# Patient Record
Sex: Male | Born: 1960
Health system: Southern US, Community
[De-identification: ages and names within clinical notes are randomized; demographics above are authoritative.]

## PROBLEM LIST (undated history)

## (undated) DIAGNOSIS — M5412 Radiculopathy, cervical region: Secondary | ICD-10-CM

## (undated) DIAGNOSIS — I10 Essential (primary) hypertension: Secondary | ICD-10-CM

---

## 2002-11-03 ENCOUNTER — Emergency Department (HOSPITAL_COMMUNITY): Admission: EM | Admit: 2002-11-03 | Discharge: 2002-11-03 | Payer: Self-pay | Admitting: Emergency Medicine

## 2007-12-09 ENCOUNTER — Emergency Department (HOSPITAL_COMMUNITY): Admission: EM | Admit: 2007-12-09 | Discharge: 2007-12-09 | Payer: Self-pay | Admitting: Family Medicine

## 2009-01-21 ENCOUNTER — Encounter: Admission: RE | Admit: 2009-01-21 | Discharge: 2009-01-21 | Payer: Self-pay | Admitting: Infectious Diseases

## 2011-12-18 ENCOUNTER — Encounter (HOSPITAL_COMMUNITY): Payer: Self-pay | Admitting: Emergency Medicine

## 2011-12-18 DIAGNOSIS — R51 Headache: Secondary | ICD-10-CM | POA: Insufficient documentation

## 2011-12-18 DIAGNOSIS — I10 Essential (primary) hypertension: Secondary | ICD-10-CM | POA: Insufficient documentation

## 2011-12-18 NOTE — ED Notes (Signed)
Reports headaches since Sunday night- reports feeling lethargic- vomited yesterday; blurred vision; pt face symmetrical, grips equal, speech clear, no drift

## 2011-12-19 ENCOUNTER — Encounter (HOSPITAL_COMMUNITY): Payer: Self-pay | Admitting: Radiology

## 2011-12-19 ENCOUNTER — Emergency Department (HOSPITAL_COMMUNITY): Payer: PRIVATE HEALTH INSURANCE

## 2011-12-19 ENCOUNTER — Emergency Department (HOSPITAL_COMMUNITY)
Admission: EM | Admit: 2011-12-19 | Discharge: 2011-12-19 | Disposition: A | Discharge status: Home / Self Care | Payer: PRIVATE HEALTH INSURANCE | Attending: Emergency Medicine | Admitting: Emergency Medicine

## 2011-12-19 DIAGNOSIS — R51 Headache: Secondary | ICD-10-CM

## 2011-12-19 DIAGNOSIS — I10 Essential (primary) hypertension: Secondary | ICD-10-CM

## 2011-12-19 LAB — CBC WITH DIFFERENTIAL/PLATELET
Basophils Absolute: 0 10*3/uL (ref 0.0–0.1)
Eosinophils Absolute: 0.1 10*3/uL (ref 0.0–0.7)
Lymphocytes Relative: 39 % (ref 12–46)
Lymphs Abs: 2.1 10*3/uL (ref 0.7–4.0)
Neutrophils Relative %: 51 % (ref 43–77)
Platelets: 217 10*3/uL (ref 150–400)
RBC: 4.04 MIL/uL — ABNORMAL LOW (ref 4.22–5.81)
WBC: 5.3 10*3/uL (ref 4.0–10.5)

## 2011-12-19 LAB — COMPREHENSIVE METABOLIC PANEL
ALT: 13 U/L (ref 0–53)
AST: 15 U/L (ref 0–37)
Alkaline Phosphatase: 78 U/L (ref 39–117)
CO2: 25 mEq/L (ref 19–32)
GFR calc Af Amer: >90 mL/min (ref >90)
Glucose, Bld: 112 mg/dL — ABNORMAL HIGH (ref 70–99)
Potassium: 3.4 mEq/L — ABNORMAL LOW (ref 3.5–5.1)
Sodium: 142 mEq/L (ref 135–145)
Total Protein: 7.2 g/dL (ref 6.0–8.3)

## 2011-12-19 MED ORDER — SODIUM CHLORIDE 0.9 % IV SOLN: INTRAVENOUS | Status: DC

## 2011-12-19 MED ORDER — SODIUM CHLORIDE 0.9 % IV BOLUS (SEPSIS)
250.0000 mL | Freq: Once | INTRAVENOUS | Status: AC
  Administered 2011-12-19: 250 mL via INTRAVENOUS

## 2011-12-19 MED ORDER — ONDANSETRON HCL 4 MG/2ML IJ SOLN
INTRAMUSCULAR | Status: AC
  Filled 2011-12-19: qty 2

## 2011-12-19 MED ORDER — HYDROCODONE-ACETAMINOPHEN 5-325 MG PO TABS: 1.0000 | ORAL_TABLET | Freq: Four times a day (QID) | ORAL | Status: AC | PRN

## 2011-12-19 MED ORDER — HYDROMORPHONE HCL PF 1 MG/ML IJ SOLN
INTRAMUSCULAR | Status: AC
  Filled 2011-12-19: qty 1

## 2011-12-19 MED ORDER — ONDANSETRON HCL 4 MG/2ML IJ SOLN
4.0000 mg | Freq: Once | INTRAMUSCULAR | Status: AC
  Administered 2011-12-19: 4 mg via INTRAVENOUS

## 2011-12-19 MED ORDER — ONDANSETRON HCL 4 MG/2ML IJ SOLN: 4.0000 mg | Freq: Once | INTRAMUSCULAR | Status: DC

## 2011-12-19 MED ORDER — HYDROMORPHONE HCL PF 1 MG/ML IJ SOLN: 1.0000 mg | Freq: Once | INTRAMUSCULAR | Status: DC

## 2011-12-19 MED ORDER — HYDROMORPHONE HCL PF 1 MG/ML IJ SOLN
1.0000 mg | Freq: Once | INTRAMUSCULAR | Status: AC
  Administered 2011-12-19: 1 mg via INTRAVENOUS

## 2011-12-19 NOTE — ED Provider Notes (Signed)
History     CSN: [id number]  Arrival date & time 12/18/11  2335   First MD Initiated Contact with Patient 12/19/11 0025      Chief Complaint  Patient presents with  . Headache    (Consider location/radiation/quality/duration/timing/severity/associated sxs/prior treatment) Patient is a 51 y.o. male presenting with headaches. The history is provided by the patient.  Headache  The current episode started 2 days ago. The problem occurs constantly. The headache is associated with nothing. The pain is located in the frontal region. The quality of the pain is described as sharp. The pain is at a severity of 10/10. The pain is moderate. The pain does not radiate. Associated symptoms include nausea and vomiting. Pertinent negatives include no fever, no chest pressure, no near-syncope, no palpitations, no syncope and no shortness of breath.   Patient with a history of bad headaches in the past but none lately. His headache associated with some nausea some blurred vision and rare vomiting.    History reviewed. No pertinent past medical history.  History reviewed. No pertinent past surgical history.  History reviewed. No pertinent family history.  History  Substance Use Topics  . Smoking status: Never Smoker   . Smokeless tobacco: Not on file  . Alcohol Use: No      Review of Systems  Constitutional: Negative for fever.  HENT: Negative for congestion, neck pain and neck stiffness.   Eyes: Positive for visual disturbance.  Respiratory: Negative for shortness of breath.   Cardiovascular: Negative for chest pain, palpitations, syncope and near-syncope.  Gastrointestinal: Positive for nausea and vomiting. Negative for abdominal pain.  Genitourinary: Negative for dysuria.  Musculoskeletal: Negative for back pain.  Skin: Negative for rash.  Neurological: Positive for headaches. Negative for weakness and numbness.  Hematological: Does not bruise/bleed easily.    Allergies  Review of  patient's allergies indicates no known allergies.  Home Medications   Current Outpatient Rx  Name Route Sig Dispense Refill  . HYDROCODONE-ACETAMINOPHEN 5-325 MG PO TABS Oral Take 1-2 tablets by mouth every 6 (six) hours as needed for pain. 10 tablet 0  . NAPROXEN SODIUM 220 MG PO TABS Oral Take 220 mg by mouth once. For pain      BP 168/108  Pulse 70  Temp 98.2 F (36.8 C) (Oral)  Resp 20  SpO2 100%  Physical Exam  Nursing note and vitals reviewed. Constitutional: He is oriented to person, place, and time. He appears well-developed and well-nourished. No distress.  HENT:  Head: Normocephalic and atraumatic.  Mouth/Throat: Oropharynx is clear and moist.  Eyes: Conjunctivae and EOM are normal. Pupils are equal, round, and reactive to light.  Neck: Normal range of motion. Neck supple.  Cardiovascular: Normal rate, regular rhythm and normal heart sounds.   No murmur heard. Pulmonary/Chest: Effort normal and breath sounds normal.  Abdominal: Soft. Bowel sounds are normal. There is no tenderness.  Musculoskeletal: Normal range of motion.  Neurological: He is alert and oriented to person, place, and time. No cranial nerve deficit. He exhibits normal muscle tone. Coordination normal.  Skin: No rash noted.    ED Course  Procedures (including critical care time)  Labs Reviewed  CBC WITH DIFFERENTIAL - Abnormal; Notable for the following:    RBC 4.04 (*)     Hemoglobin 12.1 (*)     HCT 35.2 (*)     All other components within normal limits  COMPREHENSIVE METABOLIC PANEL - Abnormal; Notable for the following:    Potassium 3.4 (*)  Glucose, Bld 112 (*)     Total Bilirubin 0.2 (*)     All other components within normal limits   Dg Chest 2 View  12/19/2011  *RADIOLOGY REPORT*  Clinical Data: Headache  CHEST - 2 VIEW  Comparison: 01/21/2009  Findings: Lung volumes are low with crowding of the bronchovascular markings.  Heart size and upper limits of normal.  No pleural effusion.   No acute osseous abnormality.  IMPRESSION: Low volumes with crowding of the bronchovascular markings but no focal acute opacity allowing for this.  If symptoms persist, consider re-imaging at full inspiration to help differentiate from possible underlying viral / atypical etiologies.  Original Report Authenticated By: Harrel Lemon, M.D.   Ct Head Wo Contrast  12/19/2011  *RADIOLOGY REPORT*  Clinical Data: Headache  CT HEAD WITHOUT CONTRAST  Technique:  Contiguous axial images were obtained from the base of the skull through the vertex without contrast.  Comparison: None.  Findings: No acute hemorrhage, acute infarction, or mass lesion is identified.  No midline shift.  No ventriculomegaly.  No skull fracture.  Orbits and paranasal sinuses are intact.  IMPRESSION: No acute intracranial finding.  Original Report Authenticated By: Harrel Lemon, M.D.     1. Headache   2. Hypertension       MDM    Patient with a frontal headache for 2 days associated with some mild slight blurred vision some nausea and rare vomiting. Patient used to have a history of severe headaches but hasn't had any lately. Past medical history is negative. Lites workup was negative head CT was normal labs without any sniffing abnormalities patient's headache improved significantly with pain medicine.  Patient is nontoxic no acute distress. Not concerned about subarachnoid hemorrhage bleed.  Discharge home with resource guide for followup.       Shelda Jakes, MD 12/19/11 [phone]

## 2011-12-19 NOTE — ED Notes (Signed)
Pt. Returned from xray 

## 2011-12-19 NOTE — ED Notes (Signed)
Pt. To x-ray .

## 2012-08-25 ENCOUNTER — Encounter (HOSPITAL_COMMUNITY): Payer: Self-pay | Admitting: Emergency Medicine

## 2012-08-25 ENCOUNTER — Emergency Department (HOSPITAL_COMMUNITY)
Admission: EM | Admit: 2012-08-25 | Discharge: 2012-08-26 | Disposition: A | Discharge status: Home / Self Care | Payer: BC Managed Care – PPO | Attending: Emergency Medicine | Admitting: Emergency Medicine

## 2012-08-25 DIAGNOSIS — IMO0001 Reserved for inherently not codable concepts without codable children: Secondary | ICD-10-CM | POA: Insufficient documentation

## 2012-08-25 DIAGNOSIS — M5412 Radiculopathy, cervical region: Secondary | ICD-10-CM

## 2012-08-25 NOTE — ED Notes (Signed)
PT. REPORTS MUSCLE PAIN AT RIGHT DELTOID RADIATING TO RIGHT SHOULDER/RIGHT NECK , STATES PAIN DUE TO WRITING A LOT / USING COMPUTER MOUSE . PAIN WORSE WITH MOVEMENT/PALPATION .

## 2012-08-26 MED ORDER — OXYCODONE-ACETAMINOPHEN 5-325 MG PO TABS: 2.0000 | ORAL_TABLET | ORAL | Status: DC | PRN

## 2012-08-26 MED ORDER — PREDNISONE 20 MG PO TABS: ORAL_TABLET | ORAL | Status: DC

## 2012-08-26 NOTE — ED Notes (Signed)
The patient is AOx4 and comfortable with his discharge instructions. 

## 2012-08-26 NOTE — ED Provider Notes (Signed)
History     CSN: [id number]  Arrival date & time 08/25/12  2223   First MD Initiated Contact with Patient 08/25/12 2358      Chief Complaint  Patient presents with  . Arm Pain    (Consider location/radiation/quality/duration/timing/severity/associated sxs/prior treatment) HPI This 52 year old male has a couple weeks of a constant nonstop burning severe right-sided neck pain radiating down towards his right shoulder and upper arm towards the elbow worse with certain position changes and better when he tries to sleep at night with his arm as above and behind his head, he does not have any chest pain cough shortness of breath exertional pain abdominal pain trauma or weakness or numbness to his right arm and no change in bowel or bladder function and no pain in his left arm or his legs. He has had like this off-and-on for weeks that time over the years but never saw a doctor for it before. There is no treatment prior to arrival. History reviewed. No pertinent past medical history.  History reviewed. No pertinent past surgical history.  No family history on file.  History  Substance Use Topics  . Smoking status: Never Smoker   . Smokeless tobacco: Not on file  . Alcohol Use: No      Review of Systems 10 Systems reviewed and are negative for acute change except as noted in the HPI. Allergies  Review of patient's allergies indicates no known allergies.  Home Medications   Current Outpatient Rx  Name  Route  Sig  Dispense  Refill  . oxyCODONE-acetaminophen (PERCOCET) 5-325 MG per tablet   Oral   Take 2 tablets by mouth every 4 (four) hours as needed for pain.   20 tablet   0   . predniSONE (DELTASONE) 20 MG tablet      3 tabs po day one, then 2 po daily x 4 days   11 tablet   0     BP 127/84  Pulse 74  Temp(Src) 97.1 F (36.2 C) (Oral)  Resp 18  Ht 5\' 8"  (1.727 m)  Wt 175 lb (79.379 kg)  BMI 26.61 kg/m2  SpO2 98%  Physical Exam  Nursing note and vitals  reviewed. Constitutional:  Awake, alert, nontoxic appearance.  HENT:  Head: Atraumatic.  Eyes: Right eye exhibits no discharge. Left eye exhibits no discharge.  Neck: Neck supple.  Cervical spine nontender, right paracervical mild tenderness soft tissue without erythema induration fluctuance or increased warmth, right trapezius area tender as well  Cardiovascular: Normal rate and regular rhythm.   No murmur heard. Pulmonary/Chest: Effort normal and breath sounds normal. No respiratory distress. He has no wheezes. He has no rales. He exhibits no tenderness.  Abdominal: Soft. Bowel sounds are normal. There is no tenderness. There is no rebound.  Musculoskeletal: He exhibits no edema and no tenderness.  Baseline ROM, no obvious new focal weakness. Left arm and both legs nontender. Right arm is nontender at the shoulder elbow forearm wrist and hand. Right hand has capillary refill less than 2 seconds. Right arm has normal light touch and 5 out of 5 strength in the distributions of the median radial ulnar and axillary nerve function. Right shoulder has negative drop test and no worsening pain with internal or external rotation.  Neurological: He is alert.  Mental status and motor strength appears baseline for patient and situation.  Skin: No rash noted.  Psychiatric: He has a normal mood and affect.    ED Course  Procedures (including  critical care time)  Labs Reviewed - No data to display No results found.   1. Cervical radiculopathy       MDM  Patient / Family / Caregiver informed of clinical course, understand medical decision-making process, and agree with plan.  I doubt any other EMC precluding discharge at this time including, but not necessarily limited to the following:CVA, ACS.        Hurman Horn, MD 08/29/12 2055

## 2012-09-01 ENCOUNTER — Emergency Department (HOSPITAL_COMMUNITY)
Admission: EM | Admit: 2012-09-01 | Discharge: 2012-09-01 | Disposition: A | Discharge status: Home / Self Care | Payer: BC Managed Care – PPO | Attending: Emergency Medicine | Admitting: Emergency Medicine

## 2012-09-01 ENCOUNTER — Encounter (HOSPITAL_COMMUNITY): Payer: Self-pay | Admitting: Emergency Medicine

## 2012-09-01 DIAGNOSIS — M5412 Radiculopathy, cervical region: Secondary | ICD-10-CM

## 2012-09-01 DIAGNOSIS — M25519 Pain in unspecified shoulder: Secondary | ICD-10-CM | POA: Insufficient documentation

## 2012-09-01 MED ORDER — OXYCODONE-ACETAMINOPHEN 5-325 MG PO TABS: 1.0000 | ORAL_TABLET | ORAL | Status: DC | PRN

## 2012-09-01 NOTE — ED Notes (Signed)
Pt c/o neck and right shoulder pain x several weeks; pt seen here for same

## 2012-09-01 NOTE — ED Provider Notes (Signed)
History  This chart was scribed for John Booze, MD by Shari Heritage, ED Scribe. The patient was seen in room TR05C/TR05C. Patient's care was started at 1630.   CSN: [id number]  Arrival date & time 09/01/12  1559   First MD Initiated Contact with Patient 09/01/12 1630      Chief Complaint  Patient presents with  . Neck Pain  . Shoulder Pain     The history is provided by the patient. No language interpreter was used.    HPI Comments: John Collins is a 52 y.o. male who presents to the Emergency Department complaining of moderate to severe, right-sided neck pain that radiates down his right shoulder and right upper arm for the past several weeks. Patient describes pain as "spasm."  Pain is relieved with rest of his right arm. He says that pain is aggravating by use including writing and computer work. Patient reports that when pain first began several weeks ago, it was intermittent, but it is recurring more frequently now. Patient says the medicines given last week during his last visit for the same problem have relieved pain, but he has run out. He states that he was unable to set up an appointment for neurology and is having persistent pain. There is no fever, nausea, vomiting, chills, numbness or weakness. He reports no other pertinent past medical history. Patient does not smoke.   Previous Chart Review: Per medical records, patient was seen here on 08/25/2012 for right-sided neck pain radiating down his right shoulder and upper arm. Patient was diagnosed with cervical radiculopathy and given 11 tablets of prednisone 20 mg and 20 tablets of Percocet 5-325 mg. Patient also given referral to neurology.  History reviewed. No pertinent past medical history.  History reviewed. No pertinent past surgical history.  History reviewed. No pertinent family history.  History  Substance Use Topics  . Smoking status: Never Smoker   . Smokeless tobacco: Not on file  . Alcohol Use: No       Review of Systems  Constitutional: Negative for fever and chills.  HENT: Positive for neck pain.   Gastrointestinal: Negative for nausea and vomiting.  Neurological: Negative for weakness and numbness.    Allergies  Review of patient's allergies indicates no known allergies.  Home Medications   Current Outpatient Rx  Name  Route  Sig  Dispense  Refill  . oxyCODONE-acetaminophen (PERCOCET) 5-325 MG per tablet   Oral   Take 2 tablets by mouth every 4 (four) hours as needed for pain.   20 tablet   0   . predniSONE (DELTASONE) 20 MG tablet      3 tabs po day one, then 2 po daily x 4 days   11 tablet   0     Triage Vitals: BP 107/89  Pulse 94  Temp(Src) 98.3 F (36.8 C) (Oral)  Resp 18  SpO2 97%  Physical Exam  Constitutional: He is oriented to person, place, and time. He appears well-developed and well-nourished.  HENT:  Head: Normocephalic and atraumatic.  Eyes: Conjunctivae and EOM are normal. Pupils are equal, round, and reactive to light.  Neck: Normal range of motion. Neck supple.  Cardiovascular: Normal rate, regular rhythm and normal heart sounds.   Pulmonary/Chest: Effort normal and breath sounds normal.  Musculoskeletal: Normal range of motion.  Neurological: He is alert and oriented to person, place, and time.  Motor and sensory exam of the right arm is completely normal. Normal strength of all muscles tested. No sensory  loss.   Skin: Skin is warm and dry.    ED Course  Procedures (including critical care time) DIAGNOSTIC STUDIES: Oxygen Saturation is 97% on room air, adequate by my interpretation.    COORDINATION OF CARE: 4:38 PM- Patient informed of current plan for treatment and evaluation and agrees with plan at this time.     1. Cervical radiculopathy       MDM  . Old records are reviewed and he had been seen one week ago with the same complaint and given a course of prednisone and Percocet. There is no indication for continuing  steroids and I do not see any indication for imaging at this time. He had been referred to [facility]. I'm also giving him a referral to Dr. Arbutus Leas of [facility] if he might be able to get in sooner. He is advised to take over-the-counter naproxen and a prescription is given for more Percocet.     I personally performed the services described in this documentation, which was scribed in my presence. The recorded information has been reviewed and is accurate.     John Booze, MD 09/01/12 [phone]

## 2012-09-01 NOTE — ED Notes (Signed)
Pt states he has been unable to get an appointment with referral MD as of yet. Needs more pain meds until then.

## 2012-09-05 ENCOUNTER — Encounter (HOSPITAL_COMMUNITY): Payer: Self-pay | Admitting: *Deleted

## 2012-09-05 ENCOUNTER — Emergency Department (HOSPITAL_COMMUNITY)
Admission: EM | Admit: 2012-09-05 | Discharge: 2012-09-05 | Disposition: A | Discharge status: Home / Self Care | Payer: Self-pay | Attending: Emergency Medicine | Admitting: Emergency Medicine

## 2012-09-05 DIAGNOSIS — Z87828 Personal history of other (healed) physical injury and trauma: Secondary | ICD-10-CM | POA: Insufficient documentation

## 2012-09-05 DIAGNOSIS — M5412 Radiculopathy, cervical region: Secondary | ICD-10-CM | POA: Insufficient documentation

## 2012-09-05 DIAGNOSIS — M79609 Pain in unspecified limb: Secondary | ICD-10-CM | POA: Insufficient documentation

## 2012-09-05 HISTORY — DX: Radiculopathy, cervical region: M54.12

## 2012-09-05 MED ORDER — IBUPROFEN 800 MG PO TABS: 800.0000 mg | ORAL_TABLET | Freq: Three times a day (TID) | ORAL | Status: DC

## 2012-09-05 MED ORDER — HYDROCODONE-ACETAMINOPHEN 5-325 MG PO TABS: 1.0000 | ORAL_TABLET | Freq: Four times a day (QID) | ORAL | Status: DC | PRN

## 2012-09-05 MED ORDER — CYCLOBENZAPRINE HCL 10 MG PO TABS: 10.0000 mg | ORAL_TABLET | Freq: Two times a day (BID) | ORAL | Status: DC | PRN

## 2012-09-05 NOTE — ED Notes (Signed)
Ortho tech at bedside 

## 2012-09-05 NOTE — ED Provider Notes (Signed)
  Medical screening examination/treatment/procedure(s) were performed by non-physician practitioner and as supervising physician I was immediately available for consultation/collaboration.    Gerhard Munch, MD 09/05/12 [phone]

## 2012-09-05 NOTE — ED Notes (Signed)
Pt seen here twice 3/24 and 3/31 and tx for cervical radiculopathy.  States his neuro appointment isn't until 4/9 and his pain is unbearable. His still has medications, but they are not controlling the pain.

## 2012-09-05 NOTE — Progress Notes (Signed)
Orthopedic Tech Progress Note Patient Details:  John Collins Dec 01, 1960 [id number]  Ortho Devices Type of Ortho Device: Soft collar Ortho Device/Splint Location: neck Ortho Device/Splint Interventions: Application   [name] 09/05/2012, 3:46 PM

## 2012-09-05 NOTE — ED Notes (Signed)
Pt reports 10/10 constant neck and upper right arm pain.  Pt states he cannot get into any comfortable position and cannot rest.  Pt has been seen 3/24 and 3/31 for same pain but states the pain medication isnt helping him.  Pt has neuro appt 4/9 but cannot tolerate pain in the mean time.  Pt denies injury.  Pt alert oriented X4

## 2012-09-05 NOTE — ED Provider Notes (Signed)
History    This chart was scribed for non-physician practitioner working with Gerhard Munch, MD by Frederik Pear, ED Scribe. This patient was seen in room TR06C/TR06C and the patient's care was started at 1518.   CSN: [id number]  Arrival date & time 09/05/12  1423   None     Chief Complaint  Patient presents with  . Neck Pain  . Arm Pain    (Consider location/radiation/quality/duration/timing/severity/associated sxs/prior treatment) Patient is a 52 y.o. male presenting with general illness. The history is provided by the patient and medical records. No language interpreter was used.  Illness  The current episode started more than 2 weeks ago. The onset was gradual. The problem occurs continuously. The problem has been gradually worsening. The problem is severe. Associated symptoms include neck pain.    John Collins is a 52 y.o. male with no h/o of prior neck injuries who presents to the Emergency Department complaining of gradual onset, gradually worsening, constant, severe right arm pain that began several weeks ago that has since radiated to the right side of his neck and his right shoulder. He denies any known injury, but reports that the pain is aggravated by spending more time typing on the computer.  He was seen here for the same on 3/24 and 03/31 and was prescribed a course of prednisone and Percocet for cervical radiculopathy, which he reports is providing no relief for the pain. He states that he has an appointment with a neurologist on 04/09, but is unable to withstand the current pain.  Past Medical History  Diagnosis Date  . Cervical radiculopathy     No past surgical history on file.  No family history on file.  History  Substance Use Topics  . Smoking status: Never Smoker   . Smokeless tobacco: Not on file  . Alcohol Use: No      Review of Systems  HENT: Positive for neck pain.   Musculoskeletal:       Right arm and shoulder pain  All other systems  reviewed and are negative.    Allergies  Review of patient's allergies indicates no known allergies.  Home Medications   Current Outpatient Rx  Name  Route  Sig  Dispense  Refill  . oxyCODONE-acetaminophen (PERCOCET/ROXICET) 5-325 MG per tablet   Oral   Take 1 tablet by mouth every 4 (four) hours as needed for pain.   20 tablet   0     BP 160/98  Pulse 82  Temp(Src) 98.5 F (36.9 C) (Oral)  Resp 18  SpO2 98%  Physical Exam  Nursing note and vitals reviewed. Constitutional: He is oriented to person, place, and time. He appears well-developed and well-nourished. No distress.  HENT:  Head: Normocephalic and atraumatic.  Eyes: EOM are normal. Pupils are equal, round, and reactive to light.  Neck: Normal range of motion. Neck supple. No tracheal deviation present.  Cardiovascular: Normal rate.   Pulmonary/Chest: Effort normal. No respiratory distress.  Abdominal: Soft. He exhibits no distension.  Musculoskeletal: Normal range of motion. He exhibits tenderness. He exhibits no edema.  Mild paraspinal cervical tenderness adjacent to the right arm. No tenderness of the elbow, wrist, or fingers. No lumbar or thoracic tenderness.  Neurological: He is alert and oriented to person, place, and time.  Neurovascularly intact. Normal grip strength.  Skin: Skin is warm and dry.  Psychiatric: He has a normal mood and affect. His behavior is normal.    ED Course  Procedures (including critical care time)  DIAGNOSTIC STUDIES: Oxygen Saturation is 98% on room air, normal by my interpretation.    COORDINATION OF CARE:  15:25- Discussed planned course of treatment with the patient, muscle relaxer and pain control medication, who is agreeable at this time.  Labs Reviewed - No data to display No results found.   No diagnosis found.    MDM  Patient reports he has an appointment with [facility] on 09/10/12.  Patient states he not receiving relief of pain from current medication regimen.   Describes spasm-like pain in neck radiating to back of head, with continuing pain in right arm.  No history of injury to neck or arm.  No extremity strength deficits noted.  Will provide soft collar for cervical support, add muscle relaxant, and change pain medication to vicodin.  I personally performed the services described in this documentation, which was scribed in my presence. The recorded information has been reviewed and is accurate.       Jimmye Norman, NP 09/05/12 1600

## 2012-09-10 ENCOUNTER — Ambulatory Visit (INDEPENDENT_AMBULATORY_CARE_PROVIDER_SITE_OTHER): Payer: BC Managed Care – PPO | Admitting: Neurology

## 2012-09-10 ENCOUNTER — Encounter: Payer: Self-pay | Admitting: Neurology

## 2012-09-10 VITALS — BP 140/70 | Ht 68.0 in | Wt 191.0 lb

## 2012-09-10 DIAGNOSIS — M5412 Radiculopathy, cervical region: Secondary | ICD-10-CM | POA: Insufficient documentation

## 2012-09-10 MED ORDER — CYCLOBENZAPRINE HCL 10 MG PO TABS: 10.0000 mg | ORAL_TABLET | Freq: Two times a day (BID) | ORAL | Status: DC | PRN

## 2012-09-10 MED ORDER — HYDROCODONE-ACETAMINOPHEN 5-325 MG PO TABS: 1.0000 | ORAL_TABLET | Freq: Four times a day (QID) | ORAL | Status: DC | PRN

## 2012-09-10 NOTE — Progress Notes (Signed)
John Collins is a 52 years old right-handed African American male, referred by emergency room for evaluation of right-sided neck pain  He was previously healthy, used to work in heavy labor job, currently is in school,full time, this is his 4th year, expecting to graduating in May 2014, he spent a lot of time reading, in front of the computer.  He complains of neck pain, radiating pain to right shoulder, and right lateral forearm for one year, it was intermittent initially, getting better if he limit his time in front of the computer, or bending down reading books, since March, it has become constant sometimes unbearable, he presented it to the emergency room 3 times, he March 20 fourth, March 30 first, April fourth, he complains of pain fro radiating to right lateral forearm, constant,  He was getting prescription of Flexeril 10 mg twice a day, hydrocodone with Tylenol, which helped his pain, but complains of drowsiness, .  He denies Lhermitt signs, he has no gait difficulty, no left arm involvement, he has no bowel or bladder incontinence.  Review of Systems  Out of a complete 14 system review, the patient complains of only the following symptoms, and all other reviewed systems are negative.   Constitutional:   Weight, fatigue. Cardiovascular:  N/A Ear/Nose/Throat:  N/A Skin: N/A Eyes: N/A Respiratory: snoring Gastroitestinal: constiptation   Hematology/Lymphatic:  N/A Endocrine:  N/A Musculoskeletal: aching muscles. Allergy/Immunology: N/A Neurological: sleepiness, snoring. Psychiatric:    Anxiety, not enough sleep, decreased energy  PHYSICAL EXAMINATOINS:  Generalized: In no acute distress  Neck: Supple, no carotid bruits   Cardiac: Regular rate rhythm  Pulmonary: Clear to auscultation bilaterally  Musculoskeletal: No deformity  Neurological examination  Mentation: Alert oriented to time, place, history taking, and causual conversation  Cranial nerve II-XII: Pupils were  equal round reactive to light extraocular movements were full, visual field were full on confrontational test. facial sensation and strength were normal. hearing was intact to finger rubbing bilaterally. Uvula tongue midline.  head turning and shoulder shrug and were normal and symmetric.Tongue protrusion into cheek strength was normal.  Motor: normal tone, bulk and strength.  Sensory: Intact to fine touch, pinprick, preserved vibratory sensation, and proprioception at toes.  Coordination: Normal finger to nose, heel-to-shin bilaterally there was no truncal ataxia  Gait: Rising up from seated position without assistance, normal stance, without trunk ataxia, moderate stride, good arm swing, smooth turning, able to perform tiptoe, and heel walking without difficulty.   Romberg signs: Negative  Deep tendon reflexes: Brachioradialis 1/2, biceps 1/2, triceps 2/2, patellar 2/2, Achilles 2/2, plantar responses were flexor bilaterally.  Assessment and plan  52 years old right-handed African American male with past medical history of neck pain, radiating pain to right shoulder, right lateral forearm, decreased right biceps reflex, next  1, most likely right C5 radiculopathy  2, MRI of cervical, EMG nerve conduction study  3. Refill Flexeril, hydrocodone/Tylenol,

## 2012-09-12 ENCOUNTER — Encounter (INDEPENDENT_AMBULATORY_CARE_PROVIDER_SITE_OTHER): Payer: PRIVATE HEALTH INSURANCE

## 2012-09-12 ENCOUNTER — Ambulatory Visit (INDEPENDENT_AMBULATORY_CARE_PROVIDER_SITE_OTHER): Payer: BC Managed Care – PPO | Admitting: Neurology

## 2012-09-12 DIAGNOSIS — Z0289 Encounter for other administrative examinations: Secondary | ICD-10-CM

## 2012-09-12 DIAGNOSIS — M5412 Radiculopathy, cervical region: Secondary | ICD-10-CM

## 2012-09-16 NOTE — Procedures (Signed)
52 years old right-handed male, presenting with right neck pain, radiating pain to her right arm for one year, getting worse since March 2014,  On examination: Bilateral upper extremity motor and sensory examination was normal deep tendon reflex was normal and symmetric  Nerve conduction study: Bilateral median, and ulnar sensory and motor responses were normal.  Electromyography: Selected needle examination was performed at right upper extremity muscles, and right cervical paraspinal muscles  Needle examination of right extensor digitorum communis, triceps, biceps, deltoid, was normal,  There was no spontaneous activity of the right cervical paraspinal muscles, right C5, 6, 7,  In conclusion: This is a normal study, there is no electrodiagnostic evidence of right upper extremity neuropathy, or right cervical radiculopathy.

## 2012-09-17 ENCOUNTER — Other Ambulatory Visit (INDEPENDENT_AMBULATORY_CARE_PROVIDER_SITE_OTHER): Payer: BC Managed Care – PPO

## 2012-09-17 DIAGNOSIS — M5412 Radiculopathy, cervical region: Secondary | ICD-10-CM

## 2012-09-18 NOTE — Procedures (Signed)
[facility]  NEUROIMAGING REPORT   STUDY DATE: 09/17/12 PATIENT NAME: John Collins DOB: [birth year] MRN: [id number]  ORDERING CLINICIAN: Levert Feinstein, MD  CLINICAL HISTORY: 52 year old male with neck pain radiating to the right upper extremity.  EXAM: MRI cervical spine (without)  TECHNIQUE: MRI of the cervical spine was obtained utilizing 3 mm sagittal slices from the posterior fossa down to the T3-4 level with T1, T2 and inversion recovery views. In addition 4 mm axial slices from C2-3 down to T1-2 level were included with T2 and gradient echo views. CONTRAST: no IMAGING SITE: [facility] [address]   FINDINGS:  On sagittal views the vertebral bodies have normal height and alignment.  The spinal cord is normal in size and appearance. The posterior fossa, pituitary gland and paraspinal soft tissues are unremarkable.    On axial views: C2-3, C3-4, C4-5: no spinal stenosis or foraminal narrowing C5-6: right uncovertebral bone spur and disc projection, with severe right foraminal stenosis; mild left foraminal stenosis C6-7: disc bulging with mild right foraminal stenosis C7-T1, T1-2: no spinal stenosis or foraminal narrowing  Limited views of the soft tissues of the head and neck are unremarkable.  IMPRESSION:  Abnormal MRI cervical spine (without) demonstrating: 1. At C5-6: right uncovertebral bone spur and disc projection, with severe right foraminal stenosis, potential inpingement upon right C6 root. Also mild left foraminal stenosis 2. At C6-7: disc bulging with mild right foraminal stenosis  INTERPRETING PHYSICIAN:  Suanne Marker, MD Certified in Neurology, Neurophysiology and Neuroimaging  [facility] [address] [place], [place] [address] [phone]

## 2012-09-26 ENCOUNTER — Telehealth: Payer: Self-pay | Admitting: Neurology

## 2012-09-26 DIAGNOSIS — M5412 Radiculopathy, cervical region: Secondary | ICD-10-CM

## 2012-09-26 NOTE — Telephone Encounter (Signed)
I have called him, MRI showed C5-6: right uncovertebral bone spur and disc projection, with severe right foraminal stenosis; mild left foraminal stenosis

## 2013-06-09 ENCOUNTER — Ambulatory Visit: Payer: PRIVATE HEALTH INSURANCE | Attending: Internal Medicine

## 2013-07-13 ENCOUNTER — Ambulatory Visit: Payer: PRIVATE HEALTH INSURANCE

## 2013-09-25 ENCOUNTER — Emergency Department (INDEPENDENT_AMBULATORY_CARE_PROVIDER_SITE_OTHER)
Admission: EM | Admit: 2013-09-25 | Discharge: 2013-09-25 | Disposition: A | Discharge status: Home / Self Care | Payer: BC Managed Care – PPO | Source: Home / Self Care | Attending: Family Medicine | Admitting: Family Medicine

## 2013-09-25 ENCOUNTER — Encounter (HOSPITAL_COMMUNITY): Payer: Self-pay | Admitting: Emergency Medicine

## 2013-09-25 ENCOUNTER — Ambulatory Visit (HOSPITAL_COMMUNITY)
Admission: RE | Admit: 2013-09-25 | Discharge: 2013-09-25 | Disposition: A | Discharge status: Home / Self Care | Payer: BC Managed Care – PPO | Source: Ambulatory Visit | Attending: Family Medicine | Admitting: Family Medicine

## 2013-09-25 DIAGNOSIS — R05 Cough: Secondary | ICD-10-CM | POA: Insufficient documentation

## 2013-09-25 DIAGNOSIS — J45901 Unspecified asthma with (acute) exacerbation: Secondary | ICD-10-CM

## 2013-09-25 DIAGNOSIS — R059 Cough, unspecified: Secondary | ICD-10-CM | POA: Insufficient documentation

## 2013-09-25 DIAGNOSIS — R0989 Other specified symptoms and signs involving the circulatory and respiratory systems: Secondary | ICD-10-CM | POA: Insufficient documentation

## 2013-09-25 HISTORY — DX: Essential (primary) hypertension: I10

## 2013-09-25 MED ORDER — TRIAMCINOLONE ACETONIDE 40 MG/ML IJ SUSP
INTRAMUSCULAR | Status: AC
  Filled 2013-09-25: qty 1

## 2013-09-25 MED ORDER — METHYLPREDNISOLONE ACETATE 80 MG/ML IJ SUSP
INTRAMUSCULAR | Status: AC
  Filled 2013-09-25: qty 1

## 2013-09-25 MED ORDER — MINOCYCLINE HCL 100 MG PO CAPS: 100.0000 mg | ORAL_CAPSULE | Freq: Two times a day (BID) | ORAL | Status: DC

## 2013-09-25 MED ORDER — METHYLPREDNISOLONE ACETATE 40 MG/ML IJ SUSP
80.0000 mg | Freq: Once | INTRAMUSCULAR | Status: AC
  Administered 2013-09-25: 80 mg via INTRAMUSCULAR

## 2013-09-25 MED ORDER — MONTELUKAST SODIUM 10 MG PO TABS: 10.0000 mg | ORAL_TABLET | Freq: Every day | ORAL | Status: DC

## 2013-09-25 MED ORDER — TRIAMCINOLONE ACETONIDE 40 MG/ML IJ SUSP
40.0000 mg | Freq: Once | INTRAMUSCULAR | Status: AC
  Administered 2013-09-25: 40 mg via INTRAMUSCULAR

## 2013-09-25 NOTE — ED Notes (Signed)
Patient returned from xray.

## 2013-09-25 NOTE — ED Provider Notes (Signed)
CSN: [id number]     Arrival date & time 09/25/13  1014 History   First MD Initiated Contact with Patient 09/25/13 1037     Chief Complaint  Patient presents with  . Nasal Congestion  . URI   (Consider location/radiation/quality/duration/timing/severity/associated sxs/prior Treatment) Patient is a 53 y.o. male presenting with URI. The history is provided by the patient.  URI Presenting symptoms: congestion, cough and rhinorrhea   Presenting symptoms: no fever   Severity:  Moderate Onset quality:  Gradual Duration:  3 weeks Chronicity:  New Worsened by:  Nothing tried Ineffective treatments:  None tried Associated symptoms: wheezing     Past Medical History  Diagnosis Date  . Cervical radiculopathy   . Hypertension    History reviewed. No pertinent past surgical history. No family history on file. History  Substance Use Topics  . Smoking status: Never Smoker   . Smokeless tobacco: Not on file  . Alcohol Use: No    Review of Systems  Constitutional: Negative.  Negative for fever.  HENT: Positive for congestion and rhinorrhea.   Respiratory: Positive for cough and wheezing.     Allergies  Review of patient's allergies indicates no known allergies.  Home Medications   Prior to Admission medications   Medication Sig Start Date End Date Taking? Authorizing Provider  cyclobenzaprine (FLEXERIL) 10 MG tablet Take 1 tablet (10 mg total) by mouth 2 (two) times daily as needed for muscle spasms. 09/10/12   Marcial Pacas, MD  HYDROcodone-acetaminophen (NORCO/VICODIN) 5-325 MG per tablet Take 1 tablet by mouth every 6 (six) hours as needed for pain. 09/10/12   Marcial Pacas, MD  ibuprofen (ADVIL,MOTRIN) 800 MG tablet Take 1 tablet (800 mg total) by mouth 3 (three) times daily. 09/05/12   Norman Herrlich, NP   BP 169/100  Pulse 64  Temp(Src) 97.1 F (36.2 C) (Oral)  Resp 16  SpO2 96% Physical Exam  Nursing note and vitals reviewed. Constitutional: He is oriented to person, place, and  time. He appears well-developed and well-nourished.  HENT:  Head: Normocephalic.  Right Ear: External ear normal.  Left Ear: External ear normal.  Nose: Nose normal.  Mouth/Throat: Oropharynx is clear and moist.  Eyes: Pupils are equal, round, and reactive to light.  Neck: Normal range of motion. Neck supple.  Cardiovascular: Regular rhythm and normal heart sounds.   Pulmonary/Chest: He has wheezes. He has rhonchi.  Lymphadenopathy:    He has no cervical adenopathy.  Neurological: He is alert and oriented to person, place, and time.  Skin: Skin is warm and dry.    ED Course  Procedures (including critical care time) Labs Review Labs Reviewed - No data to display  Imaging Review Dg Chest 2 View  09/25/2013   CLINICAL DATA:  cough, chest cong  EXAM: CHEST  2 VIEW  COMPARISON:  DG CHEST 2 VIEW dated 12/19/2011  FINDINGS: The heart size and mediastinal contours are within normal limits. Both lungs are clear. The visualized skeletal structures are unremarkable.  IMPRESSION: No active cardiopulmonary disease.   Electronically Signed   By: Margaree Mackintosh M.D.   On: 09/25/2013 11:41    X-rays reviewed and report per radiologist.  MDM   1. Asthmatic bronchitis with acute exacerbation        Billy Fischer, MD 09/25/13 1159

## 2013-09-25 NOTE — ED Notes (Signed)
Reports chest congestion, reports yellow phlegm and now white phlegm, reports chest congestion, a slight headache, nasal stuffiness, and cough.  Patient has had symptoms for 3 weeks, [facility].  Started azithromycin on Tuesday (4/21 )

## 2013-09-25 NOTE — Discharge Instructions (Signed)
Drink plenty of fluids as discussed, use medicine as prescribed, and mucinex or delsym for cough. Return or see your doctor if further problems °

## 2013-09-26 ENCOUNTER — Telehealth (HOSPITAL_COMMUNITY): Payer: Self-pay

## 2013-09-26 NOTE — ED Notes (Signed)
[facility] called stating they did not have minocycline but did have doxycyline.  Chart reviewed by Dr Juventino Slovak.  He stated to change RX to Doxycyline 100 mg 1 BID for 7 day qty 14.  Called pharmacy they were closed left new RX on machine.

## 2013-11-11 ENCOUNTER — Ambulatory Visit: Payer: BC Managed Care – PPO | Attending: Internal Medicine | Admitting: Internal Medicine

## 2013-11-11 ENCOUNTER — Encounter: Payer: Self-pay | Admitting: Internal Medicine

## 2013-11-11 VITALS — BP 151/98 | HR 63 | Temp 98.3°F | Resp 16 | Wt 186.8 lb

## 2013-11-11 DIAGNOSIS — M5412 Radiculopathy, cervical region: Secondary | ICD-10-CM | POA: Insufficient documentation

## 2013-11-11 DIAGNOSIS — Z1211 Encounter for screening for malignant neoplasm of colon: Secondary | ICD-10-CM

## 2013-11-11 DIAGNOSIS — Z139 Encounter for screening, unspecified: Secondary | ICD-10-CM

## 2013-11-11 DIAGNOSIS — I10 Essential (primary) hypertension: Secondary | ICD-10-CM | POA: Insufficient documentation

## 2013-11-11 MED ORDER — LOSARTAN POTASSIUM 25 MG PO TABS: 25.0000 mg | ORAL_TABLET | Freq: Every day | ORAL | Status: DC

## 2013-11-11 NOTE — Patient Instructions (Signed)
DASH Diet  The DASH diet stands for "Dietary Approaches to Stop Hypertension." It is a healthy eating plan that has been shown to reduce high blood pressure (hypertension) in as little as 14 days, while also possibly providing other significant health benefits. These other health benefits include reducing the risk of breast cancer after menopause and reducing the risk of type 2 diabetes, heart disease, colon cancer, and stroke. Health benefits also include weight loss and slowing kidney failure in patients with chronic kidney disease.   DIET GUIDELINES  · Limit salt (sodium). Your diet should contain less than 1500 mg of sodium daily.  · Limit refined or processed carbohydrates. Your diet should include mostly whole grains. Desserts and added sugars should be used sparingly.  · Include small amounts of heart-healthy fats. These types of fats include nuts, oils, and tub margarine. Limit saturated and trans fats. These fats have been shown to be harmful in the body.  CHOOSING FOODS   The following food groups are based on a 2000 calorie diet. See your Registered Dietitian for individual calorie needs.  Grains and Grain Products (6 to 8 servings daily)  · Eat More Often: Whole-wheat bread, brown rice, whole-grain or wheat pasta, quinoa, popcorn without added fat or salt (air popped).  · Eat Less Often: White bread, white pasta, white rice, cornbread.  Vegetables (4 to 5 servings daily)  · Eat More Often: Fresh, frozen, and canned vegetables. Vegetables may be raw, steamed, roasted, or grilled with a minimal amount of fat.  · Eat Less Often/Avoid: Creamed or fried vegetables. Vegetables in a cheese sauce.  Fruit (4 to 5 servings daily)  · Eat More Often: All fresh, canned (in natural juice), or frozen fruits. Dried fruits without added sugar. One hundred percent fruit juice (½ cup [237 mL] daily).  · Eat Less Often: Dried fruits with added sugar. Canned fruit in light or heavy syrup.  Lean Meats, Fish, and Poultry (2  servings or less daily. One serving is 3 to 4 oz [85-114 g]).  · Eat More Often: Ninety percent or leaner ground beef, tenderloin, sirloin. Round cuts of beef, chicken breast, turkey breast. All fish. Grill, bake, or broil your meat. Nothing should be fried.  · Eat Less Often/Avoid: Fatty cuts of meat, turkey, or chicken leg, thigh, or wing. Fried cuts of meat or fish.  Dairy (2 to 3 servings)  · Eat More Often: Low-fat or fat-free milk, low-fat plain or light yogurt, reduced-fat or part-skim cheese.  · Eat Less Often/Avoid: Milk (whole, 2%). Whole milk yogurt. Full-fat cheeses.  Nuts, Seeds, and Legumes (4 to 5 servings per week)  · Eat More Often: All without added salt.  · Eat Less Often/Avoid: Salted nuts and seeds, canned beans with added salt.  Fats and Sweets (limited)  · Eat More Often: Vegetable oils, tub margarines without trans fats, sugar-free gelatin. Mayonnaise and salad dressings.  · Eat Less Often/Avoid: Coconut oils, palm oils, butter, stick margarine, cream, half and half, cookies, candy, pie.  FOR MORE INFORMATION  The Dash Diet Eating Plan: www.dashdiet.org  Document Released: 05/10/2011 Document Revised: 08/13/2011 Document Reviewed: 05/10/2011  ExitCare® Patient Information ©2014 [employer].

## 2013-11-11 NOTE — Progress Notes (Signed)
Patient Demographics  John Collins, is a 53 y.o. male  IRJ:[id number]  TKZ:[id number]  DOB - 1961/05/30  CC:  Chief Complaint  Patient presents with  . Establish Care       HPI: John Collins is a 53 y.o. male here today to establish medical care. Patient has history of hypertension and was taking lisinopril as per patient he then out of the medication but he reported to have lot of coughing with this medication and is requesting different medication for blood pressure today's blood pressure is 151/98, denies any headache dizziness chest and shortness of breath, he also history of cervicalgia and was seen a neurologist in the past and as per patient he got some steroid injection last year which held him with the symptoms currently does not have any pain but occasionally feels weak. Patient denies any fever chills. Patient never had a colonoscopy done.  Patient has No headache, No chest pain, No abdominal pain - No Nausea, No new weakness tingling or numbness, No Cough - SOB.  Allergies  Allergen Reactions  . Lisinopril     Cough    Past Medical History  Diagnosis Date  . Cervical radiculopathy   . Hypertension    Current Outpatient Prescriptions on File Prior to Visit  Medication Sig Dispense Refill  . cyclobenzaprine (FLEXERIL) 10 MG tablet Take 1 tablet (10 mg total) by mouth 2 (two) times daily as needed for muscle spasms.  60 tablet  6  . HYDROcodone-acetaminophen (NORCO/VICODIN) 5-325 MG per tablet Take 1 tablet by mouth every 6 (six) hours as needed for pain.  60 tablet  3  . ibuprofen (ADVIL,MOTRIN) 800 MG tablet Take 1 tablet (800 mg total) by mouth 3 (three) times daily.  21 tablet  0  . minocycline (MINOCIN,DYNACIN) 100 MG capsule Take 1 capsule (100 mg total) by mouth 2 (two) times daily.  14 capsule  0  . montelukast (SINGULAIR) 10 MG tablet Take 1 tablet (10 mg total) by mouth at bedtime.  30 tablet  1   No current facility-administered medications on file  prior to visit.   Family History  Problem Relation Age of Onset  . Thyroid disease Mother    History   Social History  . Marital Status: Single    Spouse Name: N/A    Number of Children: N/A  . Years of Education: N/A   Occupational History  . Not on file.   Social History Main Topics  . Smoking status: Never Smoker   . Smokeless tobacco: Not on file  . Alcohol Use: Yes  . Drug Use: No  . Sexual Activity: Not on file   Other Topics Concern  . Not on file   Social History Narrative   He lives with 2 daughter, native of [place], immigrate to [place] 13 years ago, does not work now.    Review of Systems: Constitutional: Negative for fever, chills, diaphoresis, activity change, appetite change and fatigue. HENT: Negative for ear pain, nosebleeds, congestion, facial swelling, rhinorrhea, neck pain, neck stiffness and ear discharge.  Eyes: Negative for pain, discharge, redness, itching and visual disturbance. Respiratory: Negative for cough, choking, chest tightness, shortness of breath, wheezing and stridor.  Cardiovascular: Negative for chest pain, palpitations and leg swelling. Gastrointestinal: Negative for abdominal distention. Genitourinary: Negative for dysuria, urgency, frequency, hematuria, flank pain, decreased urine volume, difficulty urinating and dyspareunia.  Musculoskeletal: Negative for back pain, joint swelling, arthralgia and gait problem. Neurological: Negative for dizziness, tremors, seizures, syncope,  facial asymmetry, speech difficulty, weakness, light-headedness, numbness and headaches.  Hematological: Negative for adenopathy. Does not bruise/bleed easily. Psychiatric/Behavioral: Negative for hallucinations, behavioral problems, confusion, dysphoric mood, decreased concentration and agitation.    Objective:   Filed Vitals:   11/11/13 1559  BP: 151/98  Pulse: 63  Temp: 98.3 F (36.8 C)  Resp: 16    Physical Exam: Constitutional: Patient appears  well-developed and well-nourished. No distress. HENT: Normocephalic, atraumatic, External right and left ear normal. Oropharynx is clear and moist.  Eyes: Conjunctivae and EOM are normal. PERRLA, no scleral icterus. Neck: Normal ROM. Neck supple. No JVD. No tracheal deviation. No thyromegaly. CVS: RRR, S1/S2 +, no murmurs, no gallops, no carotid bruit.  Pulmonary: Effort and breath sounds normal, no stridor, rhonchi, wheezes, rales.  Abdominal: Soft. BS +, no distension, tenderness, rebound or guarding.  Musculoskeletal: Normal range of motion. No edema and no tenderness.  Neuro: Alert. Normal reflexes, muscle tone coordination. No cranial nerve deficit. Skin: Skin is warm and dry. No rash noted. Not diaphoretic. No erythema. No pallor. Psychiatric: Normal mood and affect. Behavior, judgment, thought content normal.  Lab Results  Component Value Date   WBC 5.3 12/19/2011   HGB 12.1* 12/19/2011   HCT 35.2* 12/19/2011   MCV 87.1 12/19/2011   PLT 217 12/19/2011   Lab Results  Component Value Date   CREATININE 0.80 12/19/2011   BUN 9 12/19/2011   NA 142 12/19/2011   K 3.4* 12/19/2011   CL 105 12/19/2011   CO2 25 12/19/2011    No results found for this basename: HGBA1C   Lipid Panel  No results found for this basename: chol,  trig,  hdl,  cholhdl,  vldl,  ldlcalc       Assessment and plan:   1. Essential hypertension, benign Have started patient on Cozaar, advised him for DASH diet.  - losartan (COZAAR) 25 MG tablet; Take 1 tablet (25 mg total) by mouth daily.  Dispense: 30 tablet; Refill: 5  2. Screening Ordered baseline blood work. - CBC with Differential - COMPLETE METABOLIC PANEL WITH GFR - TSH - Lipid panel - Vit D  25 hydroxy (rtn osteoporosis monitoring)  3. Special screening for malignant neoplasms, colon  - Ambulatory referral to Gastroenterology        Health Maintenance -Colonoscopy: Referred to GI   Return in about 3 months (around 02/11/2014) for  hypertension.    Lorayne Marek, MD

## 2013-11-11 NOTE — Progress Notes (Signed)
Patient here to establish care Takes medication for HTN

## 2013-11-12 ENCOUNTER — Telehealth: Payer: Self-pay

## 2013-11-12 LAB — CBC WITH DIFFERENTIAL/PLATELET
Basophils Absolute: 0.1 10*3/uL (ref 0.0–0.1)
Basophils Relative: 1 % (ref 0–1)
Eosinophils Absolute: 0.1 10*3/uL (ref 0.0–0.7)
Eosinophils Relative: 1 % (ref 0–5)
HCT: 37.6 % — ABNORMAL LOW (ref 39.0–52.0)
HEMOGLOBIN: 12.7 g/dL — AB (ref 13.0–17.0)
LYMPHS ABS: 2.1 10*3/uL (ref 0.7–4.0)
LYMPHS PCT: 37 % (ref 12–46)
MCH: 28.8 pg (ref 26.0–34.0)
MCHC: 33.8 g/dL (ref 30.0–36.0)
MCV: 85.3 fL (ref 78.0–100.0)
Monocytes Absolute: 0.5 10*3/uL (ref 0.1–1.0)
Monocytes Relative: 9 % (ref 3–12)
NEUTROS PCT: 52 % (ref 43–77)
Neutro Abs: 3 10*3/uL (ref 1.7–7.7)
PLATELETS: 256 10*3/uL (ref 150–400)
RBC: 4.41 MIL/uL (ref 4.22–5.81)
RDW: 12.9 % (ref 11.5–15.5)
WBC: 5.8 10*3/uL (ref 4.0–10.5)

## 2013-11-12 LAB — COMPLETE METABOLIC PANEL WITH GFR
ALBUMIN: 4.4 g/dL (ref 3.5–5.2)
ALT: 17 U/L (ref 0–53)
AST: 17 U/L (ref 0–37)
Alkaline Phosphatase: 73 U/L (ref 39–117)
BUN: 11 mg/dL (ref 6–23)
CALCIUM: 9.3 mg/dL (ref 8.4–10.5)
CHLORIDE: 105 meq/L (ref 96–112)
CO2: 26 meq/L (ref 19–32)
Creat: 0.76 mg/dL (ref 0.50–1.35)
GFR, Est Non African American: >89 mL/min
Glucose, Bld: 87 mg/dL (ref 70–99)
Potassium: 4.4 mEq/L (ref 3.5–5.3)
SODIUM: 140 meq/L (ref 135–145)
TOTAL PROTEIN: 7.2 g/dL (ref 6.0–8.3)
Total Bilirubin: 0.4 mg/dL (ref 0.2–1.2)

## 2013-11-12 LAB — TSH: TSH: 0.574 u[IU]/mL (ref 0.350–4.500)

## 2013-11-12 LAB — LIPID PANEL
Cholesterol: 240 mg/dL — ABNORMAL HIGH (ref 0–200)
HDL: 57 mg/dL (ref >39)
LDL CALC: 165 mg/dL — AB (ref 0–99)
TRIGLYCERIDES: 88 mg/dL (ref <150)
Total CHOL/HDL Ratio: 4.2 Ratio
VLDL: 18 mg/dL (ref 0–40)

## 2013-11-12 LAB — VITAMIN D 25 HYDROXY (VIT D DEFICIENCY, FRACTURES): Vit D, 25-Hydroxy: 30 ng/mL (ref 30–89)

## 2013-11-12 NOTE — Telephone Encounter (Signed)
Message copied by Dorothe Pea on Thu Nov 12, 2013 12:55 PM ------      Message from: Lorayne Marek      Created: Thu Nov 12, 2013  9:46 AM       Blood work reviewed noticed elevated cholesterol , advise patient for low fat diet.      Also patient has borderline anemia, advise patient to take over-the-counter iron supplement, patient has already been referred to GI for screening colonoscopy. ------

## 2013-11-12 NOTE — Telephone Encounter (Signed)
Patient is aware of his lab results and to take OTC iron supplements

## 2013-11-25 ENCOUNTER — Other Ambulatory Visit: Payer: Self-pay | Admitting: Gastroenterology

## 2013-11-27 ENCOUNTER — Ambulatory Visit: Payer: PRIVATE HEALTH INSURANCE | Admitting: Internal Medicine

## 2014-02-09 ENCOUNTER — Encounter (HOSPITAL_COMMUNITY): Payer: Self-pay | Admitting: Pharmacy Technician

## 2014-02-18 NOTE — Progress Notes (Signed)
02-18-14 Pt. Driving and unable to talk further. Pt. Instructed nothing by mouth after midnight, take no AM meds, follow bowel prep instructions. Report to admitting 0930 with insurance card and picture ID.Instructed to call back to get further instructions and review of medical history when possible [phone] prior to procedure day.

## 2014-02-23 ENCOUNTER — Other Ambulatory Visit: Payer: Self-pay | Admitting: Gastroenterology

## 2014-02-25 ENCOUNTER — Encounter (HOSPITAL_COMMUNITY): Payer: Self-pay | Admitting: *Deleted

## 2014-03-01 ENCOUNTER — Encounter (HOSPITAL_COMMUNITY): Payer: Self-pay | Admitting: *Deleted

## 2014-03-01 ENCOUNTER — Ambulatory Visit (HOSPITAL_COMMUNITY): Payer: BC Managed Care – PPO | Admitting: Anesthesiology

## 2014-03-01 ENCOUNTER — Ambulatory Visit (HOSPITAL_COMMUNITY)
Admission: RE | Admit: 2014-03-01 | Discharge: 2014-03-01 | Disposition: A | Discharge status: Home / Self Care | Payer: BC Managed Care – PPO | Source: Ambulatory Visit | Attending: Gastroenterology | Admitting: Gastroenterology

## 2014-03-01 ENCOUNTER — Encounter (HOSPITAL_COMMUNITY): Payer: BC Managed Care – PPO | Admitting: Anesthesiology

## 2014-03-01 ENCOUNTER — Ambulatory Visit: Payer: BC Managed Care – PPO | Attending: Internal Medicine | Admitting: Internal Medicine

## 2014-03-01 ENCOUNTER — Encounter: Payer: Self-pay | Admitting: Internal Medicine

## 2014-03-01 ENCOUNTER — Encounter (HOSPITAL_COMMUNITY)
Admission: RE | Disposition: A | Discharge status: Home / Self Care | Payer: Self-pay | Source: Ambulatory Visit | Attending: Gastroenterology

## 2014-03-01 VITALS — BP 160/80 | HR 82 | Temp 98.2°F | Resp 16 | Wt 195.6 lb

## 2014-03-01 DIAGNOSIS — I10 Essential (primary) hypertension: Secondary | ICD-10-CM | POA: Insufficient documentation

## 2014-03-01 DIAGNOSIS — Z1211 Encounter for screening for malignant neoplasm of colon: Secondary | ICD-10-CM | POA: Insufficient documentation

## 2014-03-01 DIAGNOSIS — E785 Hyperlipidemia, unspecified: Secondary | ICD-10-CM | POA: Diagnosis not present

## 2014-03-01 DIAGNOSIS — Z23 Encounter for immunization: Secondary | ICD-10-CM | POA: Insufficient documentation

## 2014-03-01 DIAGNOSIS — D126 Benign neoplasm of colon, unspecified: Secondary | ICD-10-CM | POA: Diagnosis not present

## 2014-03-01 DIAGNOSIS — E782 Mixed hyperlipidemia: Secondary | ICD-10-CM | POA: Insufficient documentation

## 2014-03-01 HISTORY — PX: COLONOSCOPY WITH PROPOFOL: SHX5780

## 2014-03-01 SURGERY — COLONOSCOPY WITH PROPOFOL
Anesthesia: Monitor Anesthesia Care

## 2014-03-01 MED ORDER — MEPERIDINE HCL 100 MG/ML IJ SOLN: 6.2500 mg | INTRAMUSCULAR | Status: DC | PRN

## 2014-03-01 MED ORDER — PROMETHAZINE HCL 25 MG/ML IJ SOLN: 6.2500 mg | INTRAMUSCULAR | Status: DC | PRN

## 2014-03-01 MED ORDER — PROPOFOL 10 MG/ML IV BOLUS
INTRAVENOUS | Status: DC | PRN
  Administered 2014-03-01 (×3): 50 mg via INTRAVENOUS
  Administered 2014-03-01 (×2): 25 mg via INTRAVENOUS
  Administered 2014-03-01 (×3): 50 mg via INTRAVENOUS

## 2014-03-01 MED ORDER — LOSARTAN POTASSIUM 50 MG PO TABS: 50.0000 mg | ORAL_TABLET | Freq: Every day | ORAL | Status: DC

## 2014-03-01 MED ORDER — SODIUM CHLORIDE 0.9 % IV SOLN: INTRAVENOUS | Status: DC

## 2014-03-01 MED ORDER — PROPOFOL 10 MG/ML IV BOLUS
INTRAVENOUS | Status: AC
  Filled 2014-03-01: qty 20

## 2014-03-01 MED ORDER — LACTATED RINGERS IV SOLN: INTRAVENOUS | Status: DC

## 2014-03-01 MED ORDER — HYDROCORTISONE 1 % EX CREA: 1.0000 "application " | TOPICAL_CREAM | Freq: Two times a day (BID) | CUTANEOUS | Status: DC

## 2014-03-01 MED ORDER — LACTATED RINGERS IV SOLN
INTRAVENOUS | Status: DC
  Administered 2014-03-01: 1000 mL via INTRAVENOUS

## 2014-03-01 SURGICAL SUPPLY — 21 items

## 2014-03-01 NOTE — Transfer of Care (Signed)
Immediate Anesthesia Transfer of Care Note  Patient: John Collins  Procedure(s) Performed: Procedure(s): COLONOSCOPY WITH PROPOFOL (N/A)  Patient Location: PACU  Anesthesia Type:MAC  Level of Consciousness: awake, sedated and patient cooperative  Airway & Oxygen Therapy: Patient Spontanous Breathing and Patient connected to face mask oxygen  Post-op Assessment: Report given to PACU RN and Post -op Vital signs reviewed and stable  Post vital signs: Reviewed and stable  Complications: No apparent anesthesia complications

## 2014-03-01 NOTE — Op Note (Signed)
Procedure: Baseline screening colonoscopy  Endoscopist: Earle Gell  Premedication: Propofol administered by anesthesia  Procedure: Patient was placed in the left lateral decubitus position. Anal inspection and digital rectal exam were normal. The Pentax pediatric colonoscope was introduced into the rectum and advanced to the cecum. A normal-appearing ileocecal valve was identified. A normal-appearing appendiceal orifice was identified. Colonic preparation for the exam today was good.  Rectum. Normal. Retroflexed view of the distal rectum normal  Sigmoid colon and descending colon. Normal.  Splenic flexure. Normal  Transverse colon. Normal  Hepatic flexure. Normal.  Ascending colon. A 6 mm sessile polyp was removed from the proximal ascending colon with the electrocautery snare.  Cecum and ileocecal valve. Normal  Assessment: A 6 mm sessile polyp was removed from the mid ascending colon. Otherwise normal colonoscopy  Recommendation: If the polyp returns neoplastic pathologically, the patient should undergo a surveillance colonoscopy in 5 years. If the polyp returns nonneoplastic pathologically, he should undergo a screening colonoscopy in 10 years

## 2014-03-01 NOTE — Patient Instructions (Signed)

## 2014-03-01 NOTE — H&P (Signed)
  Procedure: Screening colonoscopy.  History: The patient is a 53 year old male born 20-Sep-1960. He is scheduled to undergo his first screening colonoscopy with polypectomy to prevent colon cancer.  Medication allergies: Lisinopril causes cough  Past medical history: Hypertension. Cervical radiculopathy.  Exam: The patient is alert and lying comfortably on the endoscopy stretcher. Lungs are clear to auscultation. Cardiac exam reveals a regular rhythm. Abdomen is soft and nontender to palpation  Plan: Proceed with screening colonoscopy

## 2014-03-01 NOTE — Progress Notes (Signed)
Patient  Here for follow up States was told to see his primary dr because his blood pressure has been elevated

## 2014-03-01 NOTE — Anesthesia Preprocedure Evaluation (Signed)
Anesthesia Evaluation  Patient identified by MRN, date of birth, ID band Patient awake    Reviewed: Allergy & Precautions, H&P , NPO status , Patient's Chart, lab work & pertinent test results  Airway Mallampati: II TM Distance: >3 FB Neck ROM: Full    Dental no notable dental hx.    Pulmonary neg pulmonary ROS,  breath sounds clear to auscultation  Pulmonary exam normal       Cardiovascular hypertension, Pt. on medications negative cardio ROS  Rhythm:Regular Rate:Normal     Neuro/Psych negative neurological ROS  negative psych ROS   GI/Hepatic negative GI ROS, Neg liver ROS,   Endo/Other  negative endocrine ROS  Renal/GU negative Renal ROS  negative genitourinary   Musculoskeletal negative musculoskeletal ROS (+)   Abdominal   Peds negative pediatric ROS (+)  Hematology negative hematology ROS (+)   Anesthesia Other Findings   Reproductive/Obstetrics negative OB ROS                           Anesthesia Physical Anesthesia Plan  ASA: II  Anesthesia Plan: MAC   Post-op Pain Management:    Induction:   Airway Management Planned: Simple Face Mask  Additional Equipment:   Intra-op Plan:   Post-operative Plan:   Informed Consent: I have reviewed the patients History and Physical, chart, labs and discussed the procedure including the risks, benefits and alternatives for the proposed anesthesia with the patient or authorized representative who has indicated his/her understanding and acceptance.   Dental advisory given  Plan Discussed with: CRNA  Anesthesia Plan Comments:         Anesthesia Quick Evaluation

## 2014-03-01 NOTE — Anesthesia Postprocedure Evaluation (Signed)
  Anesthesia Post-op Note  Patient: John Collins  Procedure(s) Performed: Procedure(s) (LRB): COLONOSCOPY WITH PROPOFOL (N/A)  Patient Location: PACU  Anesthesia Type: MAC  Level of Consciousness: awake and alert   Airway and Oxygen Therapy: Patient Spontanous Breathing  Post-op Pain: mild  Post-op Assessment: Post-op Vital signs reviewed, Patient's Cardiovascular Status Stable, Respiratory Function Stable, Patent Airway and No signs of Nausea or vomiting  Last Vitals:  Filed Vitals:   03/01/14 1220  BP: 166/102  Pulse: 59  Temp:   Resp: 16    Post-op Vital Signs: stable   Complications: No apparent anesthesia complications

## 2014-03-01 NOTE — Progress Notes (Signed)
MRN: [id number] Name: John Collins  Sex: male Age: 53 y.o. DOB: 1960-08-12  Allergies: Lisinopril  Chief Complaint  Patient presents with  . Follow-up    HPI: Patient is 53 y.o. male who has history of hypertension comes today reported to have his blood pressure being elevated recently, when he had a colonoscopy done today his blood pressure was high, currently denies any headache dizziness chest and shortness of breath, patient had a blood work done which was reviewed with him noticed borderline anemia, also noticed elevated cholesterol, patient also reported to have perianal itching and? History of hemorrhoids in the past, requesting some medication. Past Medical History  Diagnosis Date  . Cervical radiculopathy   . Hypertension     History reviewed. No pertinent past surgical history.    Medication List       This list is accurate as of: 03/01/14  4:12 PM.  Always use your most recent med list.               guaiFENesin 600 MG 12 hr tablet  Commonly known as:  MUCINEX  Take 1,200 mg by mouth 2 (two) times daily.     hydrocortisone cream 1 %  Apply 1 application topically 2 (two) times daily.     losartan 50 MG tablet  Commonly known as:  COZAAR  Take 1 tablet (50 mg total) by mouth daily.        Meds ordered this encounter  Medications  . losartan (COZAAR) 50 MG tablet    Sig: Take 1 tablet (50 mg total) by mouth daily.    Dispense:  30 tablet    Refill:  5  . hydrocortisone cream 1 %    Sig: Apply 1 application topically 2 (two) times daily.    Dispense:  30 g    Refill:  1    Immunization History  Administered Date(s) Administered  . Influenza,inj,Quad PF,36+ Mos 03/01/2014    Family History  Problem Relation Age of Onset  . Thyroid disease Mother     History  Substance Use Topics  . Smoking status: Never Smoker   . Smokeless tobacco: Not on file  . Alcohol Use: Yes     Comment: rare to occ. monthly    Review of Systems   As  noted in HPI  Filed Vitals:   03/01/14 1545  BP: 160/80  Pulse: 82  Temp: 98.2 F (36.8 C)  Resp: 16    Physical Exam  Physical Exam  Constitutional: No distress.  Eyes: EOM are normal. Pupils are equal, round, and reactive to light.  Neck: Neck supple.  Cardiovascular: Normal rate and regular rhythm.   Pulmonary/Chest: Breath sounds normal. No respiratory distress. He has no wheezes. He has no rales.  Musculoskeletal: He exhibits no edema.    CBC    Component Value Date/Time   WBC 5.8 11/11/2013 1623   RBC 4.41 11/11/2013 1623   HGB 12.7* 11/11/2013 1623   HCT 37.6* 11/11/2013 1623   PLT 256 11/11/2013 1623   MCV 85.3 11/11/2013 1623   LYMPHSABS 2.1 11/11/2013 1623   MONOABS 0.5 11/11/2013 1623   EOSABS 0.1 11/11/2013 1623   BASOSABS 0.1 11/11/2013 1623    CMP     Component Value Date/Time   NA 140 11/11/2013 1623   K 4.4 11/11/2013 1623   CL 105 11/11/2013 1623   CO2 26 11/11/2013 1623   GLUCOSE 87 11/11/2013 1623   BUN 11 11/11/2013 1623   CREATININE  0.76 11/11/2013 1623   CREATININE 0.80 12/19/2011 0032   CALCIUM 9.3 11/11/2013 1623   PROT 7.2 11/11/2013 1623   ALBUMIN 4.4 11/11/2013 1623   AST 17 11/11/2013 1623   ALT 17 11/11/2013 1623   ALKPHOS 73 11/11/2013 1623   BILITOT 0.4 11/11/2013 1623   GFRNONAA >89 11/11/2013 1623   GFRNONAA >90 12/19/2011 0032   GFRAA >89 11/11/2013 1623   GFRAA >90 12/19/2011 0032    Lab Results  Component Value Date/Time   CHOL 240* 11/11/2013  4:23 PM    No components found with this basename: hga1c    Lab Results  Component Value Date/Time   AST 17 11/11/2013  4:23 PM    Assessment and Plan  Need for prophylactic vaccination and inoculation against influenza Flu shot given today  Essential hypertension, benign - Plan: Blood pressure is uncontrolled, I have increased the dose of losartan (COZAAR) 50 MG tablet, also advise for DASH diet.  Other and unspecified hyperlipidemia Advise patient for low fat diet.  Health  Maintenance -Colonoscopy: Patient had colonoscopy done today.   -Vaccinations: Flu shot given today.   Return in about 3 months (around 05/31/2014) for hypertension, BP check in 2 weeks/Nurse Visit.  Lorayne Marek, MD

## 2014-03-01 NOTE — Discharge Instructions (Signed)
Colonoscopy, Care After °These instructions give you information on caring for yourself after your procedure. Your doctor may also give you more specific instructions. Call your doctor if you have any problems or questions after your procedure. °HOME CARE °· Do not drive for 24 hours. °· Do not sign important papers or use machinery for 24 hours. °· You may shower. °· You may go back to your usual activities, but go slower for the first 24 hours. °· Take rest breaks often during the first 24 hours. °· Walk around or use warm packs on your belly (abdomen) if you have belly cramping or gas. °· Drink enough fluids to keep your pee (urine) clear or pale yellow. °· Resume your normal diet. Avoid heavy or fried foods. °· Avoid drinking alcohol for 24 hours or as told by your doctor. °· Only take medicines as told by your doctor. °If a tissue sample (biopsy) was taken during the procedure:  °· Do not take aspirin or blood thinners for 7 days, or as told by your doctor. °· Do not drink alcohol for 7 days, or as told by your doctor. °· Eat soft foods for the first 24 hours. °GET HELP IF: °You still have a small amount of blood in your poop (stool) 2-3 days after the procedure. °GET HELP RIGHT AWAY IF: °· You have more than a small amount of blood in your poop. °· You see clumps of tissue (blood clots) in your poop. °· Your belly is puffy (swollen). °· You feel sick to your stomach (nauseous) or throw up (vomit). °· You have a fever. °· You have belly pain that gets worse and medicine does not help. °MAKE SURE YOU: °· Understand these instructions. °· Will watch your condition. °· Will get help right away if you are not doing well or get worse. °Document Released: 06/23/2010 Document Revised: 05/26/2013 Document Reviewed: 01/26/2013 °ExitCare® Patient Information ©2015 [employer]. This information is not intended to replace advice given to you by your health care provider. Make sure you discuss any questions you have with  your health care provider. ° °

## 2014-03-02 ENCOUNTER — Encounter (HOSPITAL_COMMUNITY): Payer: Self-pay | Admitting: Gastroenterology

## 2014-04-22 ENCOUNTER — Ambulatory Visit: Payer: BC Managed Care – PPO | Admitting: Internal Medicine

## 2014-05-03 ENCOUNTER — Encounter: Payer: Self-pay | Admitting: Internal Medicine

## 2014-05-03 ENCOUNTER — Ambulatory Visit: Payer: BC Managed Care – PPO | Attending: Internal Medicine | Admitting: Internal Medicine

## 2014-05-03 VITALS — BP 135/80 | HR 62 | Temp 98.0°F | Resp 16 | Wt 200.4 lb

## 2014-05-03 DIAGNOSIS — K649 Unspecified hemorrhoids: Secondary | ICD-10-CM | POA: Diagnosis not present

## 2014-05-03 DIAGNOSIS — I10 Essential (primary) hypertension: Secondary | ICD-10-CM | POA: Insufficient documentation

## 2014-05-03 MED ORDER — HYDROCORTISONE ACETATE 25 MG RE SUPP: 25.0000 mg | Freq: Two times a day (BID) | RECTAL | Status: DC

## 2014-05-03 NOTE — Progress Notes (Signed)
Patient here for follow up on his HTN States his blood pressure has been elevated lately Patient stated he took his medication this am

## 2014-05-03 NOTE — Patient Instructions (Signed)
High-Fiber Diet Fiber is found in fruits, vegetables, and grains. A high-fiber diet encourages the addition of more whole grains, legumes, fruits, and vegetables in your diet. The recommended amount of fiber for adult males is 38 g per day. For adult females, it is 25 g per day. Pregnant and lactating women should get 28 g of fiber per day. If you have a digestive or bowel problem, ask your caregiver for advice before adding high-fiber foods to your diet. Eat a variety of high-fiber foods instead of only a select few type of foods.  PURPOSE  To increase stool bulk.  To make bowel movements more regular to prevent constipation.  To lower cholesterol.  To prevent overeating. WHEN IS THIS DIET USED?  It may be used if you have constipation and hemorrhoids.  It may be used if you have uncomplicated diverticulosis (intestine condition) and irritable bowel syndrome.  It may be used if you need help with weight management.  It may be used if you want to add it to your diet as a protective measure against atherosclerosis, diabetes, and cancer. SOURCES OF FIBER  Whole-grain breads and cereals.  Fruits, such as apples, oranges, bananas, berries, prunes, and pears.  Vegetables, such as green peas, carrots, sweet potatoes, beets, broccoli, cabbage, spinach, and artichokes.  Legumes, such split peas, soy, lentils.  Almonds. FIBER CONTENT IN FOODS Starches and Grains / Dietary Fiber (g)  Cheerios, 1 cup / 3 g  Corn Flakes cereal, 1 cup / 0.7 g  Rice crispy treat cereal, 1 cup / 0.3 g  Instant oatmeal (cooked),  cup / 2 g  Frosted wheat cereal, 1 cup / 5.1 g  Brown, long-grain rice (cooked), 1 cup / 3.5 g  White, long-grain rice (cooked), 1 cup / 0.6 g  Enriched macaroni (cooked), 1 cup / 2.5 g Legumes / Dietary Fiber (g)  Baked beans (canned, plain, or vegetarian),  cup / 5.2 g  Kidney beans (canned),  cup / 6.8 g  Pinto beans (cooked),  cup / 5.5 g Breads and Crackers  / Dietary Fiber (g)  Plain or honey graham crackers, 2 squares / 0.7 g  Saltine crackers, 3 squares / 0.3 g  Plain, salted pretzels, 10 pieces / 1.8 g  Whole-wheat bread, 1 slice / 1.9 g  White bread, 1 slice / 0.7 g  Raisin bread, 1 slice / 1.2 g  Plain bagel, 3 oz / 2 g  Flour tortilla, 1 oz / 0.9 g  Corn tortilla, 1 small / 1.5 g  Hamburger or hotdog bun, 1 small / 0.9 g Fruits / Dietary Fiber (g)  Apple with skin, 1 medium / 4.4 g  Sweetened applesauce,  cup / 1.5 g  Banana,  medium / 1.5 g  Grapes, 10 grapes / 0.4 g  Orange, 1 small / 2.3 g  Raisin, 1.5 oz / 1.6 g  Melon, 1 cup / 1.4 g Vegetables / Dietary Fiber (g)  Green beans (canned),  cup / 1.3 g  Carrots (cooked),  cup / 2.3 g  Broccoli (cooked),  cup / 2.8 g  Peas (cooked),  cup / 4.4 g  Mashed potatoes,  cup / 1.6 g  Lettuce, 1 cup / 0.5 g  Corn (canned),  cup / 1.6 g  Tomato,  cup / 1.1 g Document Released: 05/21/2005 Document Revised: 11/20/2011 Document Reviewed: 08/23/2011 ExitCare Patient Information 2015 [place], [place]. This information is not intended to replace advice given to you by your health care provider.  Make sure you discuss any questions you have with your health care provider. DASH Eating Plan DASH stands for "Dietary Approaches to Stop Hypertension." The DASH eating plan is a healthy eating plan that has been shown to reduce high blood pressure (hypertension). Additional health benefits may include reducing the risk of type 2 diabetes mellitus, heart disease, and stroke. The DASH eating plan may also help with weight loss. WHAT DO I NEED TO KNOW ABOUT THE DASH EATING PLAN? For the DASH eating plan, you will follow these general guidelines:  Choose foods with a percent daily value for sodium of less than 5% (as listed on the food label).  Use salt-free seasonings or herbs instead of table salt or sea salt.  Check with your health care provider or pharmacist before  using salt substitutes.  Eat lower-sodium products, often labeled as "lower sodium" or "no salt added."  Eat fresh foods.  Eat more vegetables, fruits, and low-fat dairy products.  Choose whole grains. Look for the word "whole" as the first word in the ingredient list.  Choose fish and skinless chicken or turkey more often than red meat. Limit fish, poultry, and meat to 6 oz (170 g) each day.  Limit sweets, desserts, sugars, and sugary drinks.  Choose heart-healthy fats.  Limit cheese to 1 oz (28 g) per day.  Eat more home-cooked food and less restaurant, buffet, and fast food.  Limit fried foods.  Cook foods using methods other than frying.  Limit canned vegetables. If you do use them, rinse them well to decrease the sodium.  When eating at a restaurant, ask that your food be prepared with less salt, or no salt if possible. WHAT FOODS CAN I EAT? Seek help from a dietitian for individual calorie needs. Grains Whole grain or whole wheat bread. Brown rice. Whole grain or whole wheat pasta. Quinoa, bulgur, and whole grain cereals. Low-sodium cereals. Corn or whole wheat flour tortillas. Whole grain cornbread. Whole grain crackers. Low-sodium crackers. Vegetables Fresh or frozen vegetables (raw, steamed, roasted, or grilled). Low-sodium or reduced-sodium tomato and vegetable juices. Low-sodium or reduced-sodium tomato sauce and paste. Low-sodium or reduced-sodium canned vegetables.  Fruits All fresh, canned (in natural juice), or frozen fruits. Meat and Other Protein Products Ground beef (85% or leaner), grass-fed beef, or beef trimmed of fat. Skinless chicken or turkey. Ground chicken or turkey. Pork trimmed of fat. All fish and seafood. Eggs. Dried beans, peas, or lentils. Unsalted nuts and seeds. Unsalted canned beans. Dairy Low-fat dairy products, such as skim or 1% milk, 2% or reduced-fat cheeses, low-fat ricotta or cottage cheese, or plain low-fat yogurt. Low-sodium or  reduced-sodium cheeses. Fats and Oils Tub margarines without trans fats. Light or reduced-fat mayonnaise and salad dressings (reduced sodium). Avocado. Safflower, olive, or canola oils. Natural peanut or almond butter. Other Unsalted popcorn and pretzels. The items listed above may not be a complete list of recommended foods or beverages. Contact your dietitian for more options. WHAT FOODS ARE NOT RECOMMENDED? Grains White bread. White pasta. White rice. Refined cornbread. Bagels and croissants. Crackers that contain trans fat. Vegetables Creamed or fried vegetables. Vegetables in a cheese sauce. Regular canned vegetables. Regular canned tomato sauce and paste. Regular tomato and vegetable juices. Fruits Dried fruits. Canned fruit in light or heavy syrup. Fruit juice. Meat and Other Protein Products Fatty cuts of meat. Ribs, chicken wings, bacon, sausage, bologna, salami, chitterlings, fatback, hot dogs, bratwurst, and packaged luncheon meats. Salted nuts and seeds. Canned beans with salt. Dairy   Whole or 2% milk, cream, half-and-half, and cream cheese. Whole-fat or sweetened yogurt. Full-fat cheeses or blue cheese. Nondairy creamers and whipped toppings. Processed cheese, cheese spreads, or cheese curds. Condiments Onion and garlic salt, seasoned salt, table salt, and sea salt. Canned and packaged gravies. Worcestershire sauce. Tartar sauce. Barbecue sauce. Teriyaki sauce. Soy sauce, including reduced sodium. Steak sauce. Fish sauce. Oyster sauce. Cocktail sauce. Horseradish. Ketchup and mustard. Meat flavorings and tenderizers. Bouillon cubes. Hot sauce. Tabasco sauce. Marinades. Taco seasonings. Relishes. Fats and Oils Butter, stick margarine, lard, shortening, ghee, and bacon fat. Coconut, palm kernel, or palm oils. Regular salad dressings. Other Pickles and olives. Salted popcorn and pretzels. The items listed above may not be a complete list of foods and beverages to avoid. Contact your  dietitian for more information. WHERE CAN I FIND MORE INFORMATION? National Heart, Lung, and [employer] Institute: www.nhlbi.nih.gov/health/health-topics/topics/dash/ Document Released: 05/10/2011 Document Revised: 10/05/2013 Document Reviewed: 03/25/2013 ExitCare Patient Information 2015 [employer]. This information is not intended to replace advice given to you by your health care provider. Make sure you discuss any questions you have with your health care provider.  

## 2014-05-03 NOTE — Progress Notes (Signed)
MRN: [id number] Name: John Collins  Sex: male Age: 53 y.o. DOB: May 15, 1961  Allergies: Lisinopril  Chief Complaint  Patient presents with  . Follow-up    HPI: Patient is 53 y.o. male who has history of hypertension comes today for followup  as per patient he is compliant with her medication on the last visit his blood pressure medication dose was increased and his blood pressure trend has improved today manual blood pressure is 135/80, he denies any headache dizziness chest and shortness of breath, denies smoking cigarettes, as per patient he already had a colonoscopy done.patient also history of hemorrhoids and is requesting medication he has tried hydrocortisone topical cream.  Past Medical History  Diagnosis Date  . Cervical radiculopathy   . Hypertension     Past Surgical History  Procedure Laterality Date  . Colonoscopy with propofol N/A 03/01/2014    Procedure: COLONOSCOPY WITH PROPOFOL;  Surgeon: Garlan Fair, MD;  Location: [facility];  Service: Endoscopy;  Laterality: N/A;      Medication List       This list is accurate as of: 05/03/14 12:20 PM.  Always use your most recent med list.               guaiFENesin 600 MG 12 hr tablet  Commonly known as:  MUCINEX  Take 1,200 mg by mouth 2 (two) times daily.     hydrocortisone 25 MG suppository  Commonly known as:  ANUSOL-HC  Place 1 suppository (25 mg total) rectally 2 (two) times daily.     hydrocortisone cream 1 %  Apply 1 application topically 2 (two) times daily.     losartan 50 MG tablet  Commonly known as:  COZAAR  Take 1 tablet (50 mg total) by mouth daily.        Meds ordered this encounter  Medications  . hydrocortisone (ANUSOL-HC) 25 MG suppository    Sig: Place 1 suppository (25 mg total) rectally 2 (two) times daily.    Dispense:  12 suppository    Refill:  0    Immunization History  Administered Date(s) Administered  . Influenza,inj,Quad PF,36+ Mos 03/01/2014    Family  History  Problem Relation Age of Onset  . Thyroid disease Mother     History  Substance Use Topics  . Smoking status: Never Smoker   . Smokeless tobacco: Not on file  . Alcohol Use: Yes     Comment: rare to occ. monthly    Review of Systems   As noted in HPI  Filed Vitals:   05/03/14 1219  BP: 135/80  Pulse:   Temp:   Resp:     Physical Exam  Physical Exam  Constitutional: No distress.  Eyes: EOM are normal. Pupils are equal, round, and reactive to light.  Neck: Neck supple.  Cardiovascular: Normal rate and regular rhythm.   Pulmonary/Chest: Breath sounds normal. No respiratory distress. He has no wheezes. He has no rales.  Musculoskeletal: He exhibits no edema.    CBC    Component Value Date/Time   WBC 5.8 11/11/2013 1623   RBC 4.41 11/11/2013 1623   HGB 12.7* 11/11/2013 1623   HCT 37.6* 11/11/2013 1623   PLT 256 11/11/2013 1623   MCV 85.3 11/11/2013 1623   LYMPHSABS 2.1 11/11/2013 1623   MONOABS 0.5 11/11/2013 1623   EOSABS 0.1 11/11/2013 1623   BASOSABS 0.1 11/11/2013 1623    CMP     Component Value Date/Time   NA 140 11/11/2013 1623  K 4.4 11/11/2013 1623   CL 105 11/11/2013 1623   CO2 26 11/11/2013 1623   GLUCOSE 87 11/11/2013 1623   BUN 11 11/11/2013 1623   CREATININE 0.76 11/11/2013 1623   CREATININE 0.80 12/19/2011 0032   CALCIUM 9.3 11/11/2013 1623   PROT 7.2 11/11/2013 1623   ALBUMIN 4.4 11/11/2013 1623   AST 17 11/11/2013 1623   ALT 17 11/11/2013 1623   ALKPHOS 73 11/11/2013 1623   BILITOT 0.4 11/11/2013 1623   GFRNONAA >89 11/11/2013 1623   GFRNONAA >90 12/19/2011 0032   GFRAA >89 11/11/2013 1623   GFRAA >90 12/19/2011 0032    Lab Results  Component Value Date/Time   CHOL 240* 11/11/2013 04:23 PM    No components found for: HGA1C  Lab Results  Component Value Date/Time   AST 17 11/11/2013 04:23 PM    Assessment and Plan  Essential hypertension, benign Advised patient for DASH diet continue with Cozaar 50 mg daily,  will repeat blood chemistry on the next visit.  Hemorrhoids, unspecified hemorrhoid type I prescribed Anusol suppository, also advised for high fiber diet to prevent constipation.  Health Maintenance -Colonoscopy: uptodate  -Vaccinations:  uptodate with flu shot   Return in about 3 months (around 08/02/2014) for hypertension.  Lorayne Marek, MD

## 2014-06-01 ENCOUNTER — Encounter: Payer: Self-pay | Admitting: Internal Medicine

## 2014-06-01 ENCOUNTER — Ambulatory Visit: Payer: BC Managed Care – PPO | Attending: Internal Medicine | Admitting: Internal Medicine

## 2014-06-01 VITALS — BP 140/80 | HR 60 | Temp 98.0°F | Resp 16 | Wt 199.4 lb

## 2014-06-01 DIAGNOSIS — R030 Elevated blood-pressure reading, without diagnosis of hypertension: Secondary | ICD-10-CM

## 2014-06-01 DIAGNOSIS — I10 Essential (primary) hypertension: Secondary | ICD-10-CM

## 2014-06-01 DIAGNOSIS — E7800 Pure hypercholesterolemia, unspecified: Secondary | ICD-10-CM

## 2014-06-01 DIAGNOSIS — IMO0001 Reserved for inherently not codable concepts without codable children: Secondary | ICD-10-CM

## 2014-06-01 DIAGNOSIS — R1030 Lower abdominal pain, unspecified: Secondary | ICD-10-CM | POA: Diagnosis not present

## 2014-06-01 DIAGNOSIS — Z139 Encounter for screening, unspecified: Secondary | ICD-10-CM

## 2014-06-01 LAB — COMPLETE METABOLIC PANEL WITH GFR
ALK PHOS: 73 U/L (ref 39–117)
ALT: 23 U/L (ref 0–53)
AST: 18 U/L (ref 0–37)
Albumin: 4.5 g/dL (ref 3.5–5.2)
BUN: 11 mg/dL (ref 6–23)
CO2: 28 meq/L (ref 19–32)
Calcium: 9.4 mg/dL (ref 8.4–10.5)
Chloride: 104 mEq/L (ref 96–112)
Creat: 0.95 mg/dL (ref 0.50–1.35)
GFR, Est Non African American: >89 mL/min
Glucose, Bld: 77 mg/dL (ref 70–99)
Potassium: 4.3 mEq/L (ref 3.5–5.3)
Sodium: 139 mEq/L (ref 135–145)
TOTAL PROTEIN: 7.4 g/dL (ref 6.0–8.3)
Total Bilirubin: 0.3 mg/dL (ref 0.2–1.2)

## 2014-06-01 MED ORDER — CLONIDINE HCL 0.1 MG PO TABS
0.1000 mg | ORAL_TABLET | Freq: Once | ORAL | Status: AC
  Administered 2014-06-01: 0.1 mg via ORAL

## 2014-06-01 NOTE — Patient Instructions (Addendum)
DASH Eating Plan DASH stands for "Dietary Approaches to Stop Hypertension." The DASH eating plan is a healthy eating plan that has been shown to reduce high blood pressure (hypertension). Additional health benefits may include reducing the risk of type 2 diabetes mellitus, heart disease, and stroke. The DASH eating plan may also help with weight loss. WHAT DO I NEED TO KNOW ABOUT THE DASH EATING PLAN? For the DASH eating plan, you will follow these general guidelines:  Choose foods with a percent daily value for sodium of less than 5% (as listed on the food label).  Use salt-free seasonings or herbs instead of table salt or sea salt.  Check with your health care provider or pharmacist before using salt substitutes.  Eat lower-sodium products, often labeled as "lower sodium" or "no salt added."  Eat fresh foods.  Eat more vegetables, fruits, and low-fat dairy products.  Choose whole grains. Look for the word "whole" as the first word in the ingredient list.  Choose fish and skinless chicken or turkey more often than red meat. Limit fish, poultry, and meat to 6 oz (170 g) each day.  Limit sweets, desserts, sugars, and sugary drinks.  Choose heart-healthy fats.  Limit cheese to 1 oz (28 g) per day.  Eat more home-cooked food and less restaurant, buffet, and fast food.  Limit fried foods.  Cook foods using methods other than frying.  Limit canned vegetables. If you do use them, rinse them well to decrease the sodium.  When eating at a restaurant, ask that your food be prepared with less salt, or no salt if possible. WHAT FOODS CAN I EAT? Seek help from a dietitian for individual calorie needs. Grains Whole grain or whole wheat bread. Brown rice. Whole grain or whole wheat pasta. Quinoa, bulgur, and whole grain cereals. Low-sodium cereals. Corn or whole wheat flour tortillas. Whole grain cornbread. Whole grain crackers. Low-sodium crackers. Vegetables Fresh or frozen vegetables  (raw, steamed, roasted, or grilled). Low-sodium or reduced-sodium tomato and vegetable juices. Low-sodium or reduced-sodium tomato sauce and paste. Low-sodium or reduced-sodium canned vegetables.  Fruits All fresh, canned (in natural juice), or frozen fruits. Meat and Other Protein Products Ground beef (85% or leaner), grass-fed beef, or beef trimmed of fat. Skinless chicken or turkey. Ground chicken or turkey. Pork trimmed of fat. All fish and seafood. Eggs. Dried beans, peas, or lentils. Unsalted nuts and seeds. Unsalted canned beans. Dairy Low-fat dairy products, such as skim or 1% milk, 2% or reduced-fat cheeses, low-fat ricotta or cottage cheese, or plain low-fat yogurt. Low-sodium or reduced-sodium cheeses. Fats and Oils Tub margarines without trans fats. Light or reduced-fat mayonnaise and salad dressings (reduced sodium). Avocado. Safflower, olive, or canola oils. Natural peanut or almond butter. Other Unsalted popcorn and pretzels. The items listed above may not be a complete list of recommended foods or beverages. Contact your dietitian for more options. WHAT FOODS ARE NOT RECOMMENDED? Grains White bread. White pasta. White rice. Refined cornbread. Bagels and croissants. Crackers that contain trans fat. Vegetables Creamed or fried vegetables. Vegetables in a cheese sauce. Regular canned vegetables. Regular canned tomato sauce and paste. Regular tomato and vegetable juices. Fruits Dried fruits. Canned fruit in light or heavy syrup. Fruit juice. Meat and Other Protein Products Fatty cuts of meat. Ribs, chicken wings, bacon, sausage, bologna, salami, chitterlings, fatback, hot dogs, bratwurst, and packaged luncheon meats. Salted nuts and seeds. Canned beans with salt. Dairy Whole or 2% milk, cream, half-and-half, and cream cheese. Whole-fat or sweetened yogurt. Full-fat   cheeses or blue cheese. Nondairy creamers and whipped toppings. Processed cheese, cheese spreads, or cheese  curds. Condiments Onion and garlic salt, seasoned salt, table salt, and sea salt. Canned and packaged gravies. Worcestershire sauce. Tartar sauce. Barbecue sauce. Teriyaki sauce. Soy sauce, including reduced sodium. Steak sauce. Fish sauce. Oyster sauce. Cocktail sauce. Horseradish. Ketchup and mustard. Meat flavorings and tenderizers. Bouillon cubes. Hot sauce. Tabasco sauce. Marinades. Taco seasonings. Relishes. Fats and Oils Butter, stick margarine, lard, shortening, ghee, and bacon fat. Coconut, palm kernel, or palm oils. Regular salad dressings. Other Pickles and olives. Salted popcorn and pretzels. The items listed above may not be a complete list of foods and beverages to avoid. Contact your dietitian for more information. WHERE CAN I FIND MORE INFORMATION? National Heart, [employer], [employer] Institute: www.nhlbi.nih.gov/health/health-topics/topics/dash/ Document Released: 05/10/2011 Document Revised: 10/05/2013 Document Reviewed: 03/25/2013 Exit[employer] Patient Information 2015 [employer]. This information is not intended to replace advice given to you by your health care provider. Make sure you discuss any questions you have with your health care provider. Fat and Cholesterol Control Diet Fat and cholesterol levels in your blood and organs are influenced by your diet. High levels of fat and cholesterol may lead to diseases of the heart, small and large blood vessels, gallbladder, liver, and pancreas. CONTROLLING FAT AND CHOLESTEROL WITH DIET Although exercise and lifestyle factors are important, your diet is key. That is because certain foods are known to raise cholesterol and others to lower it. The goal is to balance foods for their effect on cholesterol and more importantly, to replace saturated and trans fat with other types of fat, such as monounsaturated fat, polyunsaturated fat, and omega-3 fatty acids. On average, a person should consume no more than 15 to 17 g of saturated fat daily.  Saturated and trans fats are considered "bad" fats, and they will raise LDL cholesterol. Saturated fats are primarily found in animal products such as meats, butter, and cream. However, that does not mean you need to give up all your favorite foods. Today, there are good tasting, low-fat, low-cholesterol substitutes for most of the things you like to eat. Choose low-fat or nonfat alternatives. Choose round or loin cuts of red meat. These types of cuts are lowest in fat and cholesterol. Chicken (without the skin), fish, veal, and ground turkey breast are great choices. Eliminate fatty meats, such as hot dogs and salami. Even shellfish have little or no saturated fat. Have a 3 oz (85 g) portion when you eat lean meat, poultry, or fish. Trans fats are also called "partially hydrogenated oils." They are oils that have been scientifically manipulated so that they are solid at room temperature resulting in a longer shelf life and improved taste and texture of foods in which they are added. Trans fats are found in stick margarine, some tub margarines, cookies, crackers, and baked goods.  When baking and cooking, oils are a great substitute for butter. The monounsaturated oils are especially beneficial since it is believed they lower LDL and raise HDL. The oils you should avoid entirely are saturated tropical oils, such as coconut and palm.  Remember to eat a lot from food groups that are naturally free of saturated and trans fat, including fish, fruit, vegetables, beans, grains (barley, rice, couscous, bulgur wheat), and pasta (without cream sauces).  IDENTIFYING FOODS THAT LOWER FAT AND CHOLESTEROL  Soluble fiber may lower your cholesterol. This type of fiber is found in fruits such as apples, vegetables such as broccoli, potatoes, and carrots,   legumes such as beans, peas, and lentils, and grains such as barley. Foods fortified with plant sterols (phytosterol) may also lower cholesterol. You should eat at least 2 g  per day of these foods for a cholesterol lowering effect.  Read package labels to identify low-saturated fats, trans fat free, and low-fat foods at the supermarket. Select cheeses that have only 2 to 3 g saturated fat per ounce. Use a heart-healthy tub margarine that is free of trans fats or partially hydrogenated oil. When buying baked goods (cookies, crackers), avoid partially hydrogenated oils. Breads and muffins should be made from whole grains (whole-wheat or whole oat flour, instead of "flour" or "enriched flour"). Buy non-creamy canned soups with reduced salt and no added fats.  FOOD PREPARATION TECHNIQUES  Never deep-fry. If you must fry, either stir-fry, which uses very little fat, or use non-stick cooking sprays. When possible, broil, bake, or roast meats, and steam vegetables. Instead of putting butter or margarine on vegetables, use lemon and herbs, applesauce, and cinnamon (for squash and sweet potatoes). Use nonfat yogurt, salsa, and low-fat dressings for salads.  LOW-SATURATED FAT / LOW-FAT FOOD SUBSTITUTES Meats / Saturated Fat (g)  Avoid: Steak, marbled (3 oz/85 g) / 11 g  Choose: Steak, lean (3 oz/85 g) / 4 g  Avoid: Hamburger (3 oz/85 g) / 7 g  Choose: Hamburger, lean (3 oz/85 g) / 5 g  Avoid: Ham (3 oz/85 g) / 6 g  Choose: Ham, lean cut (3 oz/85 g) / 2.4 g  Avoid: Chicken, with skin, dark meat (3 oz/85 g) / 4 g  Choose: Chicken, skin removed, dark meat (3 oz/85 g) / 2 g  Avoid: Chicken, with skin, light meat (3 oz/85 g) / 2.5 g  Choose: Chicken, skin removed, light meat (3 oz/85 g) / 1 g Dairy / Saturated Fat (g)  Avoid: Whole milk (1 cup) / 5 g  Choose: Low-fat milk, 2% (1 cup) / 3 g  Choose: Low-fat milk, 1% (1 cup) / 1.5 g  Choose: Skim milk (1 cup) / 0.3 g  Avoid: Hard cheese (1 oz/28 g) / 6 g  Choose: Skim milk cheese (1 oz/28 g) / 2 to 3 g  Avoid: Cottage cheese, 4% fat (1 cup) / 6.5 g  Choose: Low-fat cottage cheese, 1% fat (1 cup) / 1.5  g  Avoid: Ice cream (1 cup) / 9 g  Choose: Sherbet (1 cup) / 2.5 g  Choose: Nonfat frozen yogurt (1 cup) / 0.3 g  Choose: Frozen fruit bar / trace  Avoid: Whipped cream (1 tbs) / 3.5 g  Choose: Nondairy whipped topping (1 tbs) / 1 g Condiments / Saturated Fat (g)  Avoid: Mayonnaise (1 tbs) / 2 g  Choose: Low-fat mayonnaise (1 tbs) / 1 g  Avoid: Butter (1 tbs) / 7 g  Choose: Extra light margarine (1 tbs) / 1 g  Avoid: Coconut oil (1 tbs) / 11.8 g  Choose: Olive oil (1 tbs) / 1.8 g  Choose: Corn oil (1 tbs) / 1.7 g  Choose: Safflower oil (1 tbs) / 1.2 g  Choose: Sunflower oil (1 tbs) / 1.4 g  Choose: Soybean oil (1 tbs) / 2.4 g  Choose: Canola oil (1 tbs) / 1 g Document Released: 05/21/2005 Document Revised: 09/15/2012 Document Reviewed: 08/19/2013 Exit[employer] Patient Information 2015 [employer]. This information is not intended to replace advice given to you by your health care provider. Make sure you discuss any questions you have with your health care   provider.  

## 2014-06-01 NOTE — Progress Notes (Signed)
MRN: [id number] Name: John Collins  Sex: male Age: 53 y.o. DOB: 03/21/1961  Allergies: Lisinopril  Chief Complaint  Patient presents with  . Follow-up    HPI: Patient is 53 y.o. male who has to of hypertension, hyperlipidemia comes today for followup, today's blood pressure was elevated was given clonidine and repeat manual blood pressure is 140/80 as per patient he checks his blood pressure at home and is usually between 496 to 759 systolic, patient also reported to have noticed some pain/lump in his groin area and would like to be checked, denies any urinary frequency urgency dysuria any rash. Patient denies any problem with erection.  Past Medical History  Diagnosis Date  . Cervical radiculopathy   . Hypertension     Past Surgical History  Procedure Laterality Date  . Colonoscopy with propofol N/A 03/01/2014    Procedure: COLONOSCOPY WITH PROPOFOL;  Surgeon: Garlan Fair, MD;  Location: [facility];  Service: Endoscopy;  Laterality: N/A;      Medication List       This list is accurate as of: 06/01/14  5:56 PM.  Always use your most recent med list.               guaiFENesin 600 MG 12 hr tablet  Commonly known as:  MUCINEX  Take 1,200 mg by mouth 2 (two) times daily.     hydrocortisone 25 MG suppository  Commonly known as:  ANUSOL-HC  Place 1 suppository (25 mg total) rectally 2 (two) times daily.     hydrocortisone cream 1 %  Apply 1 application topically 2 (two) times daily.     losartan 50 MG tablet  Commonly known as:  COZAAR  Take 1 tablet (50 mg total) by mouth daily.        Meds ordered this encounter  Medications  . cloNIDine (CATAPRES) tablet 0.1 mg    Sig:     Immunization History  Administered Date(s) Administered  . Influenza,inj,Quad PF,36+ Mos 03/01/2014    Family History  Problem Relation Age of Onset  . Thyroid disease Mother     History  Substance Use Topics  . Smoking status: Never Smoker   . Smokeless tobacco:  Not on file  . Alcohol Use: Yes     Comment: rare to occ. monthly    Review of Systems   As noted in HPI  Filed Vitals:   06/01/14 1755  BP: 140/80  Pulse:   Temp:   Resp:     Physical Exam  Physical Exam  Eyes: EOM are normal. Pupils are equal, round, and reactive to light.  Cardiovascular: Normal rate and regular rhythm.   Pulmonary/Chest: Breath sounds normal. No respiratory distress. He has no wheezes. He has no rales.  Genitourinary:  Testes non tender no rash or lymphadenopathy, no penile discharge     CBC    Component Value Date/Time   WBC 5.8 11/11/2013 1623   RBC 4.41 11/11/2013 1623   HGB 12.7* 11/11/2013 1623   HCT 37.6* 11/11/2013 1623   PLT 256 11/11/2013 1623   MCV 85.3 11/11/2013 1623   LYMPHSABS 2.1 11/11/2013 1623   MONOABS 0.5 11/11/2013 1623   EOSABS 0.1 11/11/2013 1623   BASOSABS 0.1 11/11/2013 1623    CMP     Component Value Date/Time   NA 140 11/11/2013 1623   K 4.4 11/11/2013 1623   CL 105 11/11/2013 1623   CO2 26 11/11/2013 1623   GLUCOSE 87 11/11/2013 1623   BUN  11 11/11/2013 1623   CREATININE 0.76 11/11/2013 1623   CREATININE 0.80 12/19/2011 0032   CALCIUM 9.3 11/11/2013 1623   PROT 7.2 11/11/2013 1623   ALBUMIN 4.4 11/11/2013 1623   AST 17 11/11/2013 1623   ALT 17 11/11/2013 1623   ALKPHOS 73 11/11/2013 1623   BILITOT 0.4 11/11/2013 1623   GFRNONAA >89 11/11/2013 1623   GFRNONAA >90 12/19/2011 0032   GFRAA >89 11/11/2013 1623   GFRAA >90 12/19/2011 0032    Lab Results  Component Value Date/Time   CHOL 240* 11/11/2013 04:23 PM    No components found for: HGA1C  Lab Results  Component Value Date/Time   AST 17 11/11/2013 04:23 PM    Assessment and Plan  Elevated blood pressure - Plan: cloNIDine (CATAPRES) tablet 0.1 mg  Essential hypertension, benign - Plan: patient is currently on losartan 50 mg daily, I have discussed with patient for possible increase in the medication dosage, patient wants to hold off and  would like work on his diet and exercise, reevaluate on the next visit if persistently elevated consider increasing the dose of losartan.   High cholesterol Advised patient for low fat diet, repeat fasting lipid panel on next visit.  Screening - Plan: PSA   Health Maintenance -Colonoscopy: uptodate   -Vaccinations:  uptodate with flu shot   Return in about 3 months (around 08/31/2014) for hypertension.  Lorayne Marek, MD

## 2014-06-01 NOTE — Progress Notes (Signed)
Patient here to discuss some "manhood" issues with the doctor Patient did not elaborate on his topic Patient 's blood pressure is elevated at this visit

## 2014-06-02 LAB — PSA: PSA: 0.52 ng/mL (ref <=4.00)

## 2014-06-16 ENCOUNTER — Telehealth: Payer: Self-pay | Admitting: *Deleted

## 2014-06-16 NOTE — Telephone Encounter (Signed)
-----   Message from Lorayne Marek, MD sent at 06/02/2014  1:00 PM EST ----- Call and let the Patient know that blood work is normal.

## 2014-06-16 NOTE — Telephone Encounter (Signed)
Called to let patient know that lab result were normal.  Patient appreciative

## 2014-07-01 ENCOUNTER — Encounter: Payer: Self-pay | Admitting: Internal Medicine

## 2014-07-01 ENCOUNTER — Ambulatory Visit: Payer: BC Managed Care – PPO | Attending: Internal Medicine | Admitting: Internal Medicine

## 2014-07-01 VITALS — BP 151/82 | HR 80 | Temp 98.0°F | Resp 16 | Wt 197.0 lb

## 2014-07-01 DIAGNOSIS — I10 Essential (primary) hypertension: Secondary | ICD-10-CM

## 2014-07-01 DIAGNOSIS — Z23 Encounter for immunization: Secondary | ICD-10-CM | POA: Diagnosis not present

## 2014-07-01 NOTE — Progress Notes (Signed)
MRN: [id number] Name: John Collins  Sex: male Age: 54 y.o. DOB: 1961-02-23  Allergies: Lisinopril  Chief Complaint  Patient presents with  . Follow-up    HPI: Patient is 54 y.o. male who has history of hypertension hyperlipidemia comes today for followup as per patient [place] and needed a medication for blood pressure which he already filled in at the pharmacy,he denies any acute symptoms denies any headache dizziness chest and shortness of breath denies any urinary symptoms, patient did have questions regarding sexual health but denies any erectile dysfunction.  Past Medical History  Diagnosis Date  . Cervical radiculopathy   . Hypertension     Past Surgical History  Procedure Laterality Date  . Colonoscopy with propofol N/A 03/01/2014    Procedure: COLONOSCOPY WITH PROPOFOL;  Surgeon: Garlan Fair, MD;  Location: [facility];  Service: Endoscopy;  Laterality: N/A;      Medication List       This list is accurate as of: 07/01/14  4:41 PM.  Always use your most recent med list.               guaiFENesin 600 MG 12 hr tablet  Commonly known as:  MUCINEX  Take 1,200 mg by mouth 2 (two) times daily.     hydrocortisone 25 MG suppository  Commonly known as:  ANUSOL-HC  Place 1 suppository (25 mg total) rectally 2 (two) times daily.     hydrocortisone cream 1 %  Apply 1 application topically 2 (two) times daily.     losartan 50 MG tablet  Commonly known as:  COZAAR  Take 1 tablet (50 mg total) by mouth daily.        No orders of the defined types were placed in this encounter.    Immunization History  Administered Date(s) Administered  . Influenza,inj,Quad PF,36+ Mos 03/01/2014    Family History  Problem Relation Age of Onset  . Thyroid disease Mother     History  Substance Use Topics  . Smoking status: Never Smoker   . Smokeless tobacco: Not on file  . Alcohol Use: Yes     Comment: rare to occ. monthly    Review of  Systems   As noted in HPI  Filed Vitals:   07/01/14 1551  BP: 151/82  Pulse: 80  Temp: 98 F (36.7 C)  Resp: 16    Physical Exam  Physical Exam  Constitutional: No distress.  Eyes: EOM are normal. Pupils are equal, round, and reactive to light.  Cardiovascular: Normal rate and regular rhythm.   Pulmonary/Chest: Breath sounds normal. No respiratory distress. He has no wheezes. He has no rales.  Musculoskeletal: He exhibits no edema.    CBC    Component Value Date/Time   WBC 5.8 11/11/2013 1623   RBC 4.41 11/11/2013 1623   HGB 12.7* 11/11/2013 1623   HCT 37.6* 11/11/2013 1623   PLT 256 11/11/2013 1623   MCV 85.3 11/11/2013 1623   LYMPHSABS 2.1 11/11/2013 1623   MONOABS 0.5 11/11/2013 1623   EOSABS 0.1 11/11/2013 1623   BASOSABS 0.1 11/11/2013 1623    CMP     Component Value Date/Time   NA 139 06/01/2014 1756   K 4.3 06/01/2014 1756   CL 104 06/01/2014 1756   CO2 28 06/01/2014 1756   GLUCOSE 77 06/01/2014 1756   BUN 11 06/01/2014 1756   CREATININE 0.95 06/01/2014 1756   CREATININE 0.80 12/19/2011 0032   CALCIUM 9.4 06/01/2014  1756   PROT 7.4 06/01/2014 1756   ALBUMIN 4.5 06/01/2014 1756   AST 18 06/01/2014 1756   ALT 23 06/01/2014 1756   ALKPHOS 73 06/01/2014 1756   BILITOT 0.3 06/01/2014 1756   GFRNONAA >89 06/01/2014 1756   GFRNONAA >90 12/19/2011 0032   GFRAA >89 06/01/2014 1756   GFRAA >90 12/19/2011 0032    Lab Results  Component Value Date/Time   CHOL 240* 11/11/2013 04:23 PM    No components found for: HGA1C  Lab Results  Component Value Date/Time   AST 18 06/01/2014 05:56 PM    Assessment and Plan  Essential hypertension, benign Blood pressure is borderline elevated, advise patient for DASH diet.  Need for Tdap vaccination Tetanus shot given today.  Health Maintenance  -Vaccinations: tdap today  uptodate with flu shot   Return in about 6 months (around 12/30/2014), or if symptoms worsen or fail to improve.  Lorayne Marek,  MD

## 2014-07-01 NOTE — Patient Instructions (Signed)
DASH Eating Plan °DASH stands for "Dietary Approaches to Stop Hypertension." The DASH eating plan is a healthy eating plan that has been shown to reduce high blood pressure (hypertension). Additional health benefits may include reducing the risk of type 2 diabetes mellitus, heart disease, and stroke. The DASH eating plan may also help with weight loss. °WHAT DO I NEED TO KNOW ABOUT THE DASH EATING PLAN? °For the DASH eating plan, you will follow these general guidelines: °· Choose foods with a percent daily value for sodium of less than 5% (as listed on the food label). °· Use salt-free seasonings or herbs instead of table salt or sea salt. °· Check with your health care provider or pharmacist before using salt substitutes. °· Eat lower-sodium products, often labeled as "lower sodium" or "no salt added." °· Eat fresh foods. °· Eat more vegetables, fruits, and low-fat dairy products. °· Choose whole grains. Look for the word "whole" as the first word in the ingredient list. °· Choose fish and skinless chicken or turkey more often than red meat. Limit fish, poultry, and meat to 6 oz (170 g) each day. °· Limit sweets, desserts, sugars, and sugary drinks. °· Choose heart-healthy fats. °· Limit cheese to 1 oz (28 g) per day. °· Eat more home-cooked food and less restaurant, buffet, and fast food. °· Limit fried foods. °· Cook foods using methods other than frying. °· Limit canned vegetables. If you do use them, rinse them well to decrease the sodium. °· When eating at a restaurant, ask that your food be prepared with less salt, or no salt if possible. °WHAT FOODS CAN I EAT? °Seek help from a dietitian for individual calorie needs. °Grains °Whole grain or whole wheat bread. Brown rice. Whole grain or whole wheat pasta. Quinoa, bulgur, and whole grain cereals. Low-sodium cereals. Corn or whole wheat flour tortillas. Whole grain cornbread. Whole grain crackers. Low-sodium crackers. °Vegetables °Fresh or frozen vegetables  (raw, steamed, roasted, or grilled). Low-sodium or reduced-sodium tomato and vegetable juices. Low-sodium or reduced-sodium tomato sauce and paste. Low-sodium or reduced-sodium canned vegetables.  °Fruits °All fresh, canned (in natural juice), or frozen fruits. °Meat and Other Protein Products °Ground beef (85% or leaner), grass-fed beef, or beef trimmed of fat. Skinless chicken or turkey. Ground chicken or turkey. Pork trimmed of fat. All fish and seafood. Eggs. Dried beans, peas, or lentils. Unsalted nuts and seeds. Unsalted canned beans. °Dairy °Low-fat dairy products, such as skim or 1% milk, 2% or reduced-fat cheeses, low-fat ricotta or cottage cheese, or plain low-fat yogurt. Low-sodium or reduced-sodium cheeses. °Fats and Oils °Tub margarines without trans fats. Light or reduced-fat mayonnaise and salad dressings (reduced sodium). Avocado. Safflower, olive, or canola oils. Natural peanut or almond butter. °Other °Unsalted popcorn and pretzels. °The items listed above may not be a complete list of recommended foods or beverages. Contact your dietitian for more options. °WHAT FOODS ARE NOT RECOMMENDED? °Grains °White bread. White pasta. White rice. Refined cornbread. Bagels and croissants. Crackers that contain trans fat. °Vegetables °Creamed or fried vegetables. Vegetables in a cheese sauce. Regular canned vegetables. Regular canned tomato sauce and paste. Regular tomato and vegetable juices. °Fruits °Dried fruits. Canned fruit in light or heavy syrup. Fruit juice. °Meat and Other Protein Products °Fatty cuts of meat. Ribs, chicken wings, bacon, sausage, bologna, salami, chitterlings, fatback, hot dogs, bratwurst, and packaged luncheon meats. Salted nuts and seeds. Canned beans with salt. °Dairy °Whole or 2% milk, cream, half-and-half, and cream cheese. Whole-fat or sweetened yogurt. Full-fat   cheeses or blue cheese. Nondairy creamers and whipped toppings. Processed cheese, cheese spreads, or cheese  curds. °Condiments °Onion and garlic salt, seasoned salt, table salt, and sea salt. Canned and packaged gravies. Worcestershire sauce. Tartar sauce. Barbecue sauce. Teriyaki sauce. Soy sauce, including reduced sodium. Steak sauce. Fish sauce. Oyster sauce. Cocktail sauce. Horseradish. Ketchup and mustard. Meat flavorings and tenderizers. Bouillon cubes. Hot sauce. Tabasco sauce. Marinades. Taco seasonings. Relishes. °Fats and Oils °Butter, stick margarine, lard, shortening, ghee, and bacon fat. Coconut, palm kernel, or palm oils. Regular salad dressings. °Other °Pickles and olives. Salted popcorn and pretzels. °The items listed above may not be a complete list of foods and beverages to avoid. Contact your dietitian for more information. °WHERE CAN I FIND MORE INFORMATION? °National Heart, [employer], [employer] Institute: www.nhlbi.nih.gov/health/health-topics/topics/dash/ °Document Released: 05/10/2011 Document Revised: 10/05/2013 Document Reviewed: 03/25/2013 °ExitCare® Patient Information ©2015 [employer]. This information is not intended to replace advice given to you by your health care provider. Make sure you discuss any questions you have with your health care provider. ° °

## 2014-07-01 NOTE — Progress Notes (Signed)
Patient states he is here to discuss male "stuff" Patient did not elaborate on what he wanted to discuss

## 2014-07-02 ENCOUNTER — Ambulatory Visit (INDEPENDENT_AMBULATORY_CARE_PROVIDER_SITE_OTHER): Payer: BC Managed Care – PPO | Admitting: Internal Medicine

## 2014-07-02 DIAGNOSIS — Z7189 Other specified counseling: Secondary | ICD-10-CM | POA: Diagnosis not present

## 2014-07-02 DIAGNOSIS — Z23 Encounter for immunization: Secondary | ICD-10-CM

## 2014-07-02 DIAGNOSIS — Z7184 Encounter for health counseling related to travel: Secondary | ICD-10-CM | POA: Insufficient documentation

## 2014-07-02 MED ORDER — CIPROFLOXACIN HCL 500 MG PO TABS: 500.0000 mg | ORAL_TABLET | Freq: Two times a day (BID) | ORAL | Status: DC

## 2014-07-02 MED ORDER — ATOVAQUONE-PROGUANIL HCL 250-100 MG PO TABS: 1.0000 | ORAL_TABLET | Freq: Every day | ORAL | Status: DC

## 2014-07-02 MED ORDER — MEFLOQUINE HCL 250 MG PO TABS: 250.0000 mg | ORAL_TABLET | ORAL | Status: DC

## 2014-07-02 NOTE — Progress Notes (Signed)
Subjective:   Brandonlee Navis is a 54 y.o. male who presents to the Infectious Disease clinic for travel consultation. Planned departure date: February          Planned return date: not confirmed, but long-term, probably at least a month Countries of travel: [place] Areas in country: urban   Accommodations: private home Purpose of travel: family visit Prior travel out of [place]: yes     Objective:   Scheduled Meds: BP meds Continuous Infusions: PRN Meds:. Medications:    Assessment:    No contraindications to travel. none     Plan:    Issues discussed: future shots, malaria, rabies, safe food/water, traveler's diarrhea, website/handouts for more information, what to do if ill upon return, what to do if ill while there, Yellow Fever and he has had YF shot within 10 years and has yellow card.  He will try mefloquine now and see if he tolerates and use if ok, otherwise malarone.  He will get a supply and refill in [place].. Immunizations recommended: Hepatitis A series and Typhoid (parenteral). Malaria prophylaxis: mefloquine, weekly dose starting one week before entering endemic area, ending 4 weeks after leaving area Traveler's diarrhea prophylaxis: ciprofloxacin. Total duration of visit: 1 Hour. Total time spent on education, counseling, coordination of care: 30 Minutes.

## 2014-07-19 ENCOUNTER — Other Ambulatory Visit: Payer: Self-pay | Admitting: Internal Medicine

## 2017-04-23 ENCOUNTER — Ambulatory Visit (HOSPITAL_COMMUNITY)
Admission: EM | Admit: 2017-04-23 | Discharge: 2017-04-23 | Disposition: A | Discharge status: Home / Self Care | Payer: BC Managed Care – PPO | Attending: Family Medicine | Admitting: Family Medicine

## 2017-04-23 ENCOUNTER — Encounter (HOSPITAL_COMMUNITY): Payer: Self-pay | Admitting: Emergency Medicine

## 2017-04-23 ENCOUNTER — Other Ambulatory Visit: Payer: Self-pay

## 2017-04-23 DIAGNOSIS — I10 Essential (primary) hypertension: Secondary | ICD-10-CM

## 2017-04-23 DIAGNOSIS — R51 Headache: Secondary | ICD-10-CM

## 2017-04-23 DIAGNOSIS — Z760 Encounter for issue of repeat prescription: Secondary | ICD-10-CM

## 2017-04-23 MED ORDER — LOSARTAN POTASSIUM 50 MG PO TABS: 50.0000 mg | ORAL_TABLET | Freq: Every day | ORAL | 0 refills | Status: DC

## 2017-04-23 NOTE — ED Provider Notes (Signed)
[facility]    CSN: [id number] Arrival date & time: 04/23/17  1252     History   Chief Complaint Chief Complaint  Patient presents with  . Medication Refill    HPI John Collins is a 56 y.o. male.   56 year old male with history of HTN, cervical radiculopathy comes in for medication refill of losartan. Patient states he usually follows [place] and [facility], but has been out of the county and just returned recently and has not been able to follow up with PCP. He has been out of his medications for 10 days. He started having a headache today, but hasn't taken anything for it. Denies blurry vision, chest pain, shortness of breath, weakness, dizziness, confusion. States he has been on losartan for years and has not had a problem with it.       Past Medical History:  Diagnosis Date  . Cervical radiculopathy   . Hypertension     Patient Active Problem List   Diagnosis Date Noted  . Travel advice encounter 07/02/2014  . Other and unspecified hyperlipidemia 03/01/2014  . Essential hypertension, benign 11/11/2013  . Cervical radiculopathy     Past Surgical History:  Procedure Laterality Date  . COLONOSCOPY WITH PROPOFOL N/A 03/01/2014   Procedure: COLONOSCOPY WITH PROPOFOL;  Surgeon: Garlan Fair, MD;  Location: [facility];  Service: Endoscopy;  Laterality: N/A;       Home Medications    Prior to Admission medications   Medication Sig Start Date End Date Taking? Authorizing Provider  atovaquone-proguanil (MALARONE) 250-100 MG TABS Take 1 tablet by mouth daily. Start 2 days prior to travel to malaria area, throughout travel and for 7 days upon return. 07/02/14   Thayer Headings, MD  guaiFENesin (MUCINEX) 600 MG 12 hr tablet Take 1,200 mg by mouth 2 (two) times daily.    [provider]  hydrocortisone (ANUSOL-HC) 25 MG suppository Place 1 suppository (25 mg total) rectally 2 (two) times daily. 05/03/14   Lorayne Marek, MD  hydrocortisone  cream 1 % Apply 1 application topically 2 (two) times daily. 03/01/14   Lorayne Marek, MD  losartan (COZAAR) 50 MG tablet Take 1 tablet (50 mg total) by mouth daily. 04/23/17   Tasia Catchings, Amy V, PA-C  mefloquine (LARIAM) 250 MG tablet Take 1 tablet (250 mg total) by mouth every 7 (seven) days. Start 2 weeks prior to travel and continue through 4 weeks after return from malarious area 07/02/14   Comer, Okey Regal, MD  mefloquine (LARIAM) 250 MG tablet Take 1 tablet (250 mg total) by mouth every 7 (seven) days. Start 2 weeks prior to travel and continue through 4 weeks after return from malarious area 07/02/14   Comer, Okey Regal, MD    Family History Family History  Problem Relation Age of Onset  . Thyroid disease Mother     Social History Social History   Tobacco Use  . Smoking status: Never Smoker  . Smokeless tobacco: Never Used  Substance Use Topics  . Alcohol use: Yes    Comment: rare to occ. monthly  . Drug use: No     Allergies   Lisinopril   Review of Systems Review of Systems  Reason unable to perform ROS: See HPI as above.     Physical Exam Triage Vital Signs ED Triage Vitals [04/23/17 1333]  Enc Vitals Group     BP (!) 164/96     Pulse Rate 66     Resp 12  Temp 98.3 F (36.8 C)     Temp Source Oral     SpO2 100 %     Weight      Height      Head Circumference      Peak Flow      Pain Score      Pain Loc      Pain Edu?      Excl. in [place]?    No data found.  Updated Vital Signs BP (!) 164/96 (BP Location: Right Arm)   Pulse 66   Temp 98.3 F (36.8 C) (Oral)   Resp 12   SpO2 100%   Physical Exam  Constitutional: He is oriented to person, place, and time. He appears well-developed and well-nourished. No distress.  HENT:  Head: Normocephalic and atraumatic.  Eyes: Conjunctivae are normal. Pupils are equal, round, and reactive to light.  Neck: Normal range of motion. Neck supple.  Cardiovascular: Normal rate, regular rhythm and normal heart sounds. Exam  reveals no gallop and no friction rub.  No murmur heard. Pulmonary/Chest: Effort normal and breath sounds normal. No stridor. No respiratory distress. He has no wheezes. He has no rales.  Neurological: He is alert and oriented to person, place, and time.     UC Treatments / Results  Labs (all labs ordered are listed, but only abnormal results are displayed) Labs Reviewed - No data to display  EKG  EKG Interpretation None       Radiology No results found.  Procedures Procedures (including critical care time)  Medications Ordered in UC Medications - No data to display   Initial Impression / Assessment and Plan / UC Course  I have reviewed the triage vital signs and the nursing notes.  Pertinent labs & imaging results that were available during my care of the patient were reviewed by me and considered in my medical decision making (see chart for details).    Losartan refilled for 30 days. Patient to follow up with PCP for further refills. Return precautions given.   Final Clinical Impressions(s) / UC Diagnoses   Final diagnoses:  Medication refill    ED Discharge Orders        Ordered    losartan (COZAAR) 50 MG tablet  Daily     04/23/17 1341        Ok Edwards, [place] 04/23/17 1352

## 2017-04-23 NOTE — ED Triage Notes (Signed)
Per pt he is one BP med and is running out of it. Per pt his head is pounding right now. Per pt he is not having chest pain. Per pt he has been out of meds about 10 days now.

## 2017-04-23 NOTE — Discharge Instructions (Signed)
I have refilled your Losartan for 30 days. Please follow up with PCP for further refills needed.

## 2017-05-15 ENCOUNTER — Other Ambulatory Visit: Payer: Self-pay | Admitting: Family Medicine

## 2017-05-15 ENCOUNTER — Telehealth: Payer: Self-pay | Admitting: General Practice

## 2017-05-15 NOTE — Telephone Encounter (Signed)
Patient has not been seen in multiple years, I will not refill this. Will defer to provider patient is establishing with.

## 2017-05-15 NOTE — Telephone Encounter (Signed)
Pt came in to request a refill on  -losartan (COZAAR) 50 MG tablet [facility] if [facility]. Pt states he only has one week left of medication. Please follow up

## 2017-05-15 NOTE — Telephone Encounter (Signed)
Schedule an office visit for patient to establish care with a provider as soon as possible within the next two weeks. Will place medication refill until day of scheduled visit. No refills after this without office visit. No exceptions.

## 2017-05-16 NOTE — Telephone Encounter (Signed)
CMA call regarding medication refill request   Patient was aware and understood

## 2017-05-16 NOTE — Telephone Encounter (Signed)
Spoke with patient he stated that he schedule an appt on 06/06/2017 & that he has 6 more days of medication so what he should do without his med

## 2017-05-17 ENCOUNTER — Other Ambulatory Visit: Payer: Self-pay | Admitting: Family Medicine

## 2017-05-17 DIAGNOSIS — I10 Essential (primary) hypertension: Secondary | ICD-10-CM

## 2017-05-17 MED ORDER — LOSARTAN POTASSIUM 50 MG PO TABS: 50.0000 mg | ORAL_TABLET | Freq: Every day | ORAL | 0 refills | Status: DC

## 2017-05-17 NOTE — Telephone Encounter (Signed)
Will refill medication up to date of office visit. Must have office visit for any further refills, no exceptions.

## 2017-05-17 NOTE — Telephone Encounter (Signed)
CMA call regarding medication refill   Patient did not answer but left a VM stating the message & to call back if have any question

## 2017-05-23 ENCOUNTER — Other Ambulatory Visit: Payer: Self-pay | Admitting: Family Medicine

## 2017-06-06 ENCOUNTER — Ambulatory Visit: Payer: Medicaid Other | Attending: Family Medicine | Admitting: Family Medicine

## 2017-06-06 ENCOUNTER — Encounter: Payer: Self-pay | Admitting: Family Medicine

## 2017-06-06 VITALS — BP 119/71 | HR 74 | Temp 98.3°F | Resp 16 | Ht 69.29 in | Wt 205.6 lb

## 2017-06-06 DIAGNOSIS — I10 Essential (primary) hypertension: Secondary | ICD-10-CM | POA: Diagnosis not present

## 2017-06-06 DIAGNOSIS — Z9119 Patient's noncompliance with other medical treatment and regimen: Secondary | ICD-10-CM | POA: Diagnosis not present

## 2017-06-06 DIAGNOSIS — Z79899 Other long term (current) drug therapy: Secondary | ICD-10-CM | POA: Insufficient documentation

## 2017-06-06 DIAGNOSIS — Z0000 Encounter for general adult medical examination without abnormal findings: Secondary | ICD-10-CM

## 2017-06-06 DIAGNOSIS — Z9111 Patient's noncompliance with dietary regimen: Secondary | ICD-10-CM | POA: Diagnosis not present

## 2017-06-06 DIAGNOSIS — Z13220 Encounter for screening for lipoid disorders: Secondary | ICD-10-CM

## 2017-06-06 MED ORDER — LOSARTAN POTASSIUM 50 MG PO TABS: 50.0000 mg | ORAL_TABLET | Freq: Every day | ORAL | 2 refills | Status: DC

## 2017-06-06 MED FILL — LOSARTAN POTASSIUM 50 MG TA: 50 | 60 days supply | Qty: 60 | Fill #0

## 2017-06-06 NOTE — Progress Notes (Signed)
Subjective:  Patient ID: John Collins, male    DOB: 1960/09/24  Age: 57 y.o. MRN: [id number]  CC: Establish Care   HPI Darell Saputo presents for hypertension follow up. HTN: He is not exercising and is not adherent to low salt diet. He does not check Bp at home. Cardiac symptoms none. Patient denies chest pain, chest pressure/discomfort, claudication, dyspnea, lower extremity edema, near-syncope, palpitations and syncope.  Cardiovascular risk factors: advanced age (older than 2 for men, 45 for women), hypertension and sedentary lifestyle. Use of agents associated with hypertension: none. History of target organ damage: none.   Outpatient Medications Prior to Visit  Medication Sig Dispense Refill  . losartan (COZAAR) 50 MG tablet Take 1 tablet (50 mg total) by mouth daily. 20 tablet 0  . guaiFENesin (MUCINEX) 600 MG 12 hr tablet Take 1,200 mg by mouth 2 (two) times daily.    . hydrocortisone (ANUSOL-HC) 25 MG suppository Place 1 suppository (25 mg total) rectally 2 (two) times daily. (Patient not taking: Reported on 06/06/2017) 12 suppository 0  . hydrocortisone cream 1 % Apply 1 application topically 2 (two) times daily. (Patient not taking: Reported on 06/06/2017) 30 g 1  . atovaquone-proguanil (MALARONE) 250-100 MG TABS Take 1 tablet by mouth daily. Start 2 days prior to travel to malaria area, throughout travel and for 7 days upon return. (Patient not taking: Reported on 06/06/2017) 14 tablet 0  . mefloquine (LARIAM) 250 MG tablet Take 1 tablet (250 mg total) by mouth every 7 (seven) days. Start 2 weeks prior to travel and continue through 4 weeks after return from malarious area (Patient not taking: Reported on 06/06/2017) 3 tablet 0  . mefloquine (LARIAM) 250 MG tablet Take 1 tablet (250 mg total) by mouth every 7 (seven) days. Start 2 weeks prior to travel and continue through 4 weeks after return from malarious area (Patient not taking: Reported on 06/06/2017) 10 tablet 0   No  facility-administered medications prior to visit.     ROS Review of Systems  Constitutional: Negative.   Respiratory: Negative.   Cardiovascular: Negative.   Neurological: Negative for syncope and weakness.  Psychiatric/Behavioral: Negative.    Objective:  BP 119/71 (BP Location: Left Arm, Patient Position: Sitting, Cuff Size: Normal)   Pulse 74   Temp 98.3 F (36.8 C) (Oral)   Resp 16   Ht 5' 9.29" (1.76 m)   Wt 205 lb 9.6 oz (93.3 kg)   SpO2 98%   BMI 30.11 kg/m   BP/Weight 06/06/2017 04/23/2017 0/93/2671  Systolic BP 245 809 983  Diastolic BP 71 96 82  Wt. (Lbs) 205.6 - 197  BMI 30.11 - 29.96   Physical Exam  Constitutional: He appears well-developed and well-nourished.  Eyes: Conjunctivae are normal. Pupils are equal, round, and reactive to light.  Neck: Normal range of motion. Neck supple. No JVD present.  Cardiovascular: Normal rate, regular rhythm, normal heart sounds and intact distal pulses.  Pulmonary/Chest: Effort normal and breath sounds normal.  Abdominal: Soft. Bowel sounds are normal.  Skin: Skin is warm and dry.  Psychiatric: He has a normal mood and affect.  Nursing note and vitals reviewed.       Assessment & Plan:   1. Essential hypertension, benign Continue current medication. - CMP and Liver - losartan (COZAAR) 50 MG tablet; Take 1 tablet (50 mg total) by mouth daily.  Dispense: 30 tablet; Refill: 2  2. Screening cholesterol level Walk in labs only visit for fasting lipids next week. -  Lipid Panel; Future  3. Healthcare maintenance  - HIV antibody (with reflex) - Hepatitis C Antibody    Follow-up: Return in about 3 months (around 09/04/2017) for HTN.   Alfonse Spruce FNP

## 2017-06-06 NOTE — Progress Notes (Signed)
Patient is here to establish care and would like to get his medication refills.

## 2017-06-06 NOTE — Patient Instructions (Signed)
Lab only visit next week for cholesterol screening. Must be fasting at least 4 hours prior to labs.  DASH Eating Plan DASH stands for "Dietary Approaches to Stop Hypertension." The DASH eating plan is a healthy eating plan that has been shown to reduce high blood pressure (hypertension). It may also reduce your risk for type 2 diabetes, heart disease, and stroke. The DASH eating plan may also help with weight loss. What are tips for following this plan? General guidelines  Avoid eating more than 2,300 mg (milligrams) of salt (sodium) a day. If you have hypertension, you may need to reduce your sodium intake to 1,500 mg a day.  Limit alcohol intake to no more than 1 drink a day for nonpregnant women and 2 drinks a day for men. One drink equals 12 oz of beer, 5 oz of wine, or 1 oz of hard liquor.  Work with your health care provider to maintain a healthy body weight or to lose weight. Ask what an ideal weight is for you.  Get at least 30 minutes of exercise that causes your heart to beat faster (aerobic exercise) most days of the week. Activities may include walking, swimming, or biking.  Work with your health care provider or diet and nutrition specialist (dietitian) to adjust your eating plan to your individual calorie needs. Reading food labels  Check food labels for the amount of sodium per serving. Choose foods with less than 5 percent of the Daily Value of sodium. Generally, foods with less than 300 mg of sodium per serving fit into this eating plan.  To find whole grains, look for the word "whole" as the first word in the ingredient list. Shopping  Buy products labeled as "low-sodium" or "no salt added."  Buy fresh foods. Avoid canned foods and premade or frozen meals. Cooking  Avoid adding salt when cooking. Use salt-free seasonings or herbs instead of table salt or sea salt. Check with your health care provider or pharmacist before using salt substitutes.  Do not fry foods. Cook  foods using healthy methods such as baking, boiling, grilling, and broiling instead.  Cook with heart-healthy oils, such as olive, canola, soybean, or sunflower oil. Meal planning   Eat a balanced diet that includes: ? 5 or more servings of fruits and vegetables each day. At each meal, try to fill half of your plate with fruits and vegetables. ? Up to 6-8 servings of whole grains each day. ? Less than 6 oz of lean meat, poultry, or fish each day. A 3-oz serving of meat is about the same size as a deck of cards. One egg equals 1 oz. ? 2 servings of low-fat dairy each day. ? A serving of nuts, seeds, or beans 5 times each week. ? Heart-healthy fats. Healthy fats called Omega-3 fatty acids are found in foods such as flaxseeds and coldwater fish, like sardines, salmon, and mackerel.  Limit how much you eat of the following: ? Canned or prepackaged foods. ? Food that is high in trans fat, such as fried foods. ? Food that is high in saturated fat, such as fatty meat. ? Sweets, desserts, sugary drinks, and other foods with added sugar. ? Full-fat dairy products.  Do not salt foods before eating.  Try to eat at least 2 vegetarian meals each week.  Eat more home-cooked food and less restaurant, buffet, and fast food.  When eating at a restaurant, ask that your food be prepared with less salt or no salt, if possible.  What foods are recommended? The items listed may not be a complete list. Talk with your dietitian about what dietary choices are best for you. Grains Whole-grain or whole-wheat bread. Whole-grain or whole-wheat pasta. Brown rice. [place]. Bulgur. Whole-grain and low-sodium cereals. Pita bread. Low-fat, low-sodium crackers. Whole-wheat flour tortillas. Vegetables Fresh or frozen vegetables (raw, steamed, roasted, or grilled). Low-sodium or reduced-sodium tomato and vegetable juice. Low-sodium or reduced-sodium tomato sauce and tomato paste. Low-sodium or reduced-sodium  canned vegetables. Fruits All fresh, dried, or frozen fruit. Canned fruit in natural juice (without added sugar). Meat and other protein foods Skinless chicken or [place]. Ground chicken or [place]. Pork with fat trimmed off. Fish and seafood. Egg whites. Dried beans, peas, or lentils. Unsalted nuts, nut butters, and seeds. Unsalted canned beans. Lean cuts of beef with fat trimmed off. Low-sodium, lean deli meat. Dairy Low-fat (1%) or fat-free (skim) milk. Fat-free, low-fat, or reduced-fat cheeses. Nonfat, low-sodium ricotta or cottage cheese. Low-fat or nonfat yogurt. Low-fat, low-sodium cheese. Fats and oils Soft margarine without trans fats. Vegetable oil. Low-fat, reduced-fat, or light mayonnaise and salad dressings (reduced-sodium). Canola, safflower, olive, soybean, and sunflower oils. Avocado. Seasoning and other foods Herbs. Spices. Seasoning mixes without salt. Unsalted popcorn and pretzels. Fat-free sweets. What foods are not recommended? The items listed may not be a complete list. Talk with your dietitian about what dietary choices are best for you. Grains Baked goods made with fat, such as croissants, muffins, or some breads. Dry pasta or rice meal packs. Vegetables Creamed or fried vegetables. Vegetables in a cheese sauce. Regular canned vegetables (not low-sodium or reduced-sodium). Regular canned tomato sauce and paste (not low-sodium or reduced-sodium). Regular tomato and vegetable juice (not low-sodium or reduced-sodium). Angie Fava. Olives. Fruits Canned fruit in a light or heavy syrup. Fried fruit. Fruit in cream or butter sauce. Meat and other protein foods Fatty cuts of meat. Ribs. Fried meat. Berniece Salines. Sausage. Bologna and other processed lunch meats. Salami. Fatback. Hotdogs. Bratwurst. Salted nuts and seeds. Canned beans with added salt. Canned or smoked fish. Whole eggs or egg yolks. Chicken or [place] with skin. Dairy Whole or 2% milk, cream, and half-and-half. Whole or  full-fat cream cheese. Whole-fat or sweetened yogurt. Full-fat cheese. Nondairy creamers. Whipped toppings. Processed cheese and cheese spreads. Fats and oils Butter. Stick margarine. Lard. Shortening. Ghee. Bacon fat. Tropical oils, such as coconut, palm kernel, or palm oil. Seasoning and other foods Salted popcorn and pretzels. Onion salt, garlic salt, seasoned salt, table salt, and sea salt. Worcestershire sauce. Tartar sauce. Barbecue sauce. Teriyaki sauce. Soy sauce, including reduced-sodium. Steak sauce. Canned and packaged gravies. Fish sauce. Oyster sauce. Cocktail sauce. Horseradish that you find on the shelf. Ketchup. Mustard. Meat flavorings and tenderizers. Bouillon cubes. Hot sauce and Tabasco sauce. Premade or packaged marinades. Premade or packaged taco seasonings. Relishes. Regular salad dressings. Where to find more information:  National Heart, Lung, and [place]: https://wilson-eaton.com/  American Heart Association: www.heart.org Summary  The DASH eating plan is a healthy eating plan that has been shown to reduce high blood pressure (hypertension). It may also reduce your risk for type 2 diabetes, heart disease, and stroke.  With the DASH eating plan, you should limit salt (sodium) intake to 2,300 mg a day. If you have hypertension, you may need to reduce your sodium intake to 1,500 mg a day.  When on the DASH eating plan, aim to eat more fresh fruits and vegetables, whole grains, lean proteins, low-fat dairy, and heart-healthy fats.  Work  with your health care provider or diet and nutrition specialist (dietitian) to adjust your eating plan to your individual calorie needs. This information is not intended to replace advice given to you by your health care provider. Make sure you discuss any questions you have with your health care provider. Document Released: 05/10/2011 Document Revised: 05/14/2016 Document Reviewed: 05/14/2016 Elsevier Interactive Patient Education  [employer].

## 2017-06-07 LAB — CMP AND LIVER
ALBUMIN: 4.4 g/dL (ref 3.5–5.5)
ALK PHOS: 92 IU/L (ref 39–117)
ALT: 32 IU/L (ref 0–44)
AST: 25 IU/L (ref 0–40)
BUN: 12 mg/dL (ref 6–24)
Bilirubin Total: 0.4 mg/dL (ref 0.0–1.2)
Bilirubin, Direct: 0.11 mg/dL (ref 0.00–0.40)
CO2: 24 mmol/L (ref 20–29)
CREATININE: 1.13 mg/dL (ref 0.76–1.27)
Calcium: 9.3 mg/dL (ref 8.7–10.2)
Chloride: 102 mmol/L (ref 96–106)
GFR calc Af Amer: 84 mL/min/{1.73_m2} (ref >59)
GFR calc non Af Amer: 72 mL/min/{1.73_m2} (ref >59)
GLUCOSE: 130 mg/dL — AB (ref 65–99)
POTASSIUM: 4 mmol/L (ref 3.5–5.2)
Sodium: 141 mmol/L (ref 134–144)
Total Protein: 7.1 g/dL (ref 6.0–8.5)

## 2017-06-07 LAB — HIV ANTIBODY (ROUTINE TESTING W REFLEX): HIV Screen 4th Generation wRfx: NONREACTIVE

## 2017-06-07 LAB — HEPATITIS C ANTIBODY: Hep C Virus Ab: <0.1 s/co ratio (ref 0.0–0.9)

## 2017-06-11 ENCOUNTER — Telehealth (INDEPENDENT_AMBULATORY_CARE_PROVIDER_SITE_OTHER): Payer: Self-pay | Admitting: *Deleted

## 2017-06-11 NOTE — Telephone Encounter (Signed)
Medical Assistant left message on patient's home and cell voicemail. Voicemail states to give a call back to [place] with [facility] at [phone]. Patient is aware of all labs being normal and to return the clinic for fating cholesterol check. !!!Please schedule patient for fasting cholesterol blood draw!!!

## 2017-06-11 NOTE — Telephone Encounter (Signed)
-----   Message from Alfonse Spruce, [place] sent at 06/10/2017  1:36 PM EST ----- Negative HIV and Hep C Kidney function normal Liver function normal Labs normal Recommend lab only visit for cholesterol screening must be fasting at least 4 hours prior.

## 2017-06-18 ENCOUNTER — Ambulatory Visit: Payer: Medicaid Other | Attending: Family Medicine | Admitting: *Deleted

## 2017-06-18 DIAGNOSIS — Z23 Encounter for immunization: Secondary | ICD-10-CM

## 2017-06-18 DIAGNOSIS — Z13220 Encounter for screening for lipoid disorders: Secondary | ICD-10-CM | POA: Diagnosis not present

## 2017-06-18 NOTE — Progress Notes (Signed)
Patient here for lab visit only 

## 2017-06-19 LAB — LIPID PANEL
Chol/HDL Ratio: 5.4 ratio — ABNORMAL HIGH (ref 0.0–5.0)
Cholesterol, Total: 254 mg/dL — ABNORMAL HIGH (ref 100–199)
HDL: 47 mg/dL (ref >39)
LDL Calculated: 183 mg/dL — ABNORMAL HIGH (ref 0–99)
Triglycerides: 121 mg/dL (ref 0–149)
VLDL CHOLESTEROL CAL: 24 mg/dL (ref 5–40)

## 2017-06-26 ENCOUNTER — Other Ambulatory Visit: Payer: Self-pay | Admitting: Family Medicine

## 2017-06-26 DIAGNOSIS — E782 Mixed hyperlipidemia: Secondary | ICD-10-CM

## 2017-06-26 MED ORDER — ATORVASTATIN CALCIUM 20 MG PO TABS: 20.0000 mg | ORAL_TABLET | Freq: Every day | ORAL | 2 refills | Status: DC

## 2017-07-03 ENCOUNTER — Telehealth (INDEPENDENT_AMBULATORY_CARE_PROVIDER_SITE_OTHER): Payer: Self-pay | Admitting: *Deleted

## 2017-07-03 NOTE — Telephone Encounter (Signed)
Medical Assistant left message on patient's home and cell voicemail. Voicemail states to give a call back to [place] with [facility] at [phone]. !!!Please inform patient of cholesterol being elevated and atorvastatin being prescribed. Patient will have a recheck completed in 3 months!!!

## 2017-07-03 NOTE — Telephone Encounter (Signed)
-----   Message from Alfonse Spruce, [place] sent at 06/26/2017 10:49 AM EST ----- Cholesterol levels were elevated. You will be prescribed atorvastatin. This can increase your risk of heart disease overtime. Start eating a diet low in saturated fat. Limit your intake of fried foods, red meats, and whole milk. Increase activity. Recommend follow up in 3 months.

## 2017-07-23 MED FILL — LOSARTAN POTASSIUM 50 MG TA: 50 | 30 days supply | Qty: 30 | Fill #1

## 2017-09-05 ENCOUNTER — Ambulatory Visit: Payer: Medicaid Other | Admitting: Family Medicine

## 2017-09-16 ENCOUNTER — Ambulatory Visit: Payer: Medicaid Other | Admitting: Family Medicine

## 2017-09-17 ENCOUNTER — Encounter: Payer: Self-pay | Admitting: Family Medicine

## 2017-09-17 ENCOUNTER — Ambulatory Visit: Payer: Medicaid Other | Attending: Family Medicine | Admitting: Family Medicine

## 2017-09-17 DIAGNOSIS — Z79899 Other long term (current) drug therapy: Secondary | ICD-10-CM | POA: Insufficient documentation

## 2017-09-17 DIAGNOSIS — E782 Mixed hyperlipidemia: Secondary | ICD-10-CM | POA: Diagnosis not present

## 2017-09-17 DIAGNOSIS — Z9889 Other specified postprocedural states: Secondary | ICD-10-CM | POA: Diagnosis not present

## 2017-09-17 DIAGNOSIS — M5412 Radiculopathy, cervical region: Secondary | ICD-10-CM | POA: Insufficient documentation

## 2017-09-17 DIAGNOSIS — I10 Essential (primary) hypertension: Secondary | ICD-10-CM | POA: Insufficient documentation

## 2017-09-17 DIAGNOSIS — Z888 Allergy status to other drugs, medicaments and biological substances status: Secondary | ICD-10-CM | POA: Diagnosis not present

## 2017-09-17 MED ORDER — ATORVASTATIN CALCIUM 20 MG PO TABS: 20.0000 mg | ORAL_TABLET | Freq: Every day | ORAL | 1 refills | Status: DC

## 2017-09-17 MED ORDER — LOSARTAN POTASSIUM 50 MG PO TABS: 50.0000 mg | ORAL_TABLET | Freq: Every day | ORAL | 1 refills | Status: DC

## 2017-09-17 NOTE — Progress Notes (Signed)
Patient has not taken BP medication today.

## 2017-09-17 NOTE — Progress Notes (Signed)
Subjective:  Patient ID: John Collins, male    DOB: [birth year]  Age: 57 y.o. MRN: [id number]  CC: Hypertension   HPI John Collins is a 57 year old male who presents to establish care with me. Medical history is significant for hypertension and hyperlipidemia and he has been compliant with his antihypertensive which he tolerates but not with his statin as he was unaware he should have been taking Lipitor. Denies chest pains, dyspnea. He has no concerns today.  Past Medical History:  Diagnosis Date  . Cervical radiculopathy   . Hypertension     Past Surgical History:  Procedure Laterality Date  . COLONOSCOPY WITH PROPOFOL N/A 03/01/2014   Procedure: COLONOSCOPY WITH PROPOFOL;  Surgeon: Garlan Fair, MD;  Location: [facility];  Service: Endoscopy;  Laterality: N/A;    Allergies  Allergen Reactions  . Lisinopril     Cough      Outpatient Medications Prior to Visit  Medication Sig Dispense Refill  . losartan (COZAAR) 50 MG tablet Take 1 tablet (50 mg total) by mouth daily. 30 tablet 2  . guaiFENesin (MUCINEX) 600 MG 12 hr tablet Take 1,200 mg by mouth 2 (two) times daily.    . hydrocortisone (ANUSOL-HC) 25 MG suppository Place 1 suppository (25 mg total) rectally 2 (two) times daily. (Patient not taking: Reported on 06/06/2017) 12 suppository 0  . hydrocortisone cream 1 % Apply 1 application topically 2 (two) times daily. (Patient not taking: Reported on 06/06/2017) 30 g 1  . atorvastatin (LIPITOR) 20 MG tablet Take 1 tablet (20 mg total) by mouth daily. (Patient not taking: Reported on 09/17/2017) 30 tablet 2   No facility-administered medications prior to visit.     ROS Review of Systems  Constitutional: Negative for activity change and appetite change.  HENT: Negative for sinus pressure and sore throat.   Eyes: Negative for visual disturbance.  Respiratory: Negative for cough, chest tightness and shortness of breath.   Cardiovascular: Negative for chest pain and leg  swelling.  Gastrointestinal: Negative for abdominal distention, abdominal pain, constipation and diarrhea.  Endocrine: Negative.   Genitourinary: Negative for dysuria.  Musculoskeletal: Negative for joint swelling and myalgias.  Skin: Negative for rash.  Allergic/Immunologic: Negative.   Neurological: Negative for weakness, light-headedness and numbness.  Psychiatric/Behavioral: Negative for dysphoric mood and suicidal ideas.    Objective:  BP (!) 166/106   Pulse 61   Temp 97.7 F (36.5 C) (Oral)   Ht _0  (1.753 m)   Wt 196 lb 3.2 oz (89 kg)   SpO2 98%   BMI 28.97 kg/m   BP/Weight 09/17/2017 06/06/2017 97/67/3419  Systolic BP 379 024 097  Diastolic BP 353 71 96  Wt. (Lbs) 196.2 205.6 -  BMI 28.97 30.11 -      Physical Exam  Constitutional: He is oriented to person, place, and time. He appears well-developed and well-nourished.  Cardiovascular: Normal rate, normal heart sounds and intact distal pulses.  No murmur heard. Pulmonary/Chest: Effort normal and breath sounds normal. He has no wheezes. He has no rales. He exhibits no tenderness.  Abdominal: Soft. Bowel sounds are normal. He exhibits no distension and no mass. There is no tenderness.  Musculoskeletal: Normal range of motion.  Neurological: He is alert and oriented to person, place, and time.  Skin: Skin is warm and dry.  Psychiatric: He has a normal mood and affect.     Assessment & Plan:   1. Mixed hyperlipidemia Uncontrolled He has not been taking his  statin so I will make no changes if lipids are elevated Advised to resume statin Low cholesterol diet - atorvastatin (LIPITOR) 20 MG tablet; Take 1 tablet (20 mg total) by mouth daily.  Dispense: 90 tablet; Refill: 1  2. Essential hypertension, benign Controlled Low sodium diet - losartan (COZAAR) 50 MG tablet; Take 1 tablet (50 mg total) by mouth daily.  Dispense: 90 tablet; Refill: 1 - CMP14+EGFR - Lipid panel   Meds ordered this encounter    Medications  . atorvastatin (LIPITOR) 20 MG tablet    Sig: Take 1 tablet (20 mg total) by mouth daily.    Dispense:  90 tablet    Refill:  1  . losartan (COZAAR) 50 MG tablet    Sig: Take 1 tablet (50 mg total) by mouth daily.    Dispense:  90 tablet    Refill:  1    Follow-up: Return in about 3 months (around 12/17/2017) for Follow-up of hypertension and hyperlipidemia.   Charlott Rakes MD

## 2017-09-17 NOTE — Patient Instructions (Signed)

## 2017-09-18 ENCOUNTER — Encounter: Payer: Self-pay | Admitting: Family Medicine

## 2017-09-18 LAB — LIPID PANEL
Chol/HDL Ratio: 4.9 ratio (ref 0.0–5.0)
Cholesterol, Total: 255 mg/dL — ABNORMAL HIGH (ref 100–199)
HDL: 52 mg/dL (ref >39)
LDL CALC: 183 mg/dL — AB (ref 0–99)
Triglycerides: 100 mg/dL (ref 0–149)
VLDL CHOLESTEROL CAL: 20 mg/dL (ref 5–40)

## 2017-09-18 LAB — CMP14+EGFR
ALBUMIN: 4.7 g/dL (ref 3.5–5.5)
ALT: 17 IU/L (ref 0–44)
AST: 19 IU/L (ref 0–40)
Albumin/Globulin Ratio: 1.6 (ref 1.2–2.2)
Alkaline Phosphatase: 93 IU/L (ref 39–117)
BILIRUBIN TOTAL: 0.4 mg/dL (ref 0.0–1.2)
BUN / CREAT RATIO: 13 (ref 9–20)
BUN: 10 mg/dL (ref 6–24)
CO2: 24 mmol/L (ref 20–29)
Calcium: 9.6 mg/dL (ref 8.7–10.2)
Chloride: 103 mmol/L (ref 96–106)
Creatinine, Ser: 0.78 mg/dL (ref 0.76–1.27)
GFR calc Af Amer: 117 mL/min/{1.73_m2} (ref >59)
GFR calc non Af Amer: 101 mL/min/{1.73_m2} (ref >59)
GLOBULIN, TOTAL: 3 g/dL (ref 1.5–4.5)
GLUCOSE: 101 mg/dL — AB (ref 65–99)
Potassium: 4.1 mmol/L (ref 3.5–5.2)
Sodium: 143 mmol/L (ref 134–144)
Total Protein: 7.7 g/dL (ref 6.0–8.5)

## 2017-09-19 ENCOUNTER — Ambulatory Visit (HOSPITAL_COMMUNITY)
Admission: RE | Admit: 2017-09-19 | Discharge: 2017-09-19 | Disposition: A | Discharge status: Home / Self Care | Payer: Medicaid Other | Source: Ambulatory Visit | Attending: Nurse Practitioner | Admitting: Nurse Practitioner

## 2017-09-19 DIAGNOSIS — Z1379 Encounter for other screening for genetic and chromosomal anomalies: Secondary | ICD-10-CM | POA: Insufficient documentation

## 2017-12-15 ENCOUNTER — Other Ambulatory Visit: Payer: Self-pay

## 2017-12-15 ENCOUNTER — Emergency Department (HOSPITAL_COMMUNITY)
Admission: EM | Admit: 2017-12-15 | Discharge: 2017-12-15 | Disposition: A | Discharge status: Home / Self Care | Payer: Medicaid Other | Attending: Emergency Medicine | Admitting: Emergency Medicine

## 2017-12-15 DIAGNOSIS — Y929 Unspecified place or not applicable: Secondary | ICD-10-CM | POA: Insufficient documentation

## 2017-12-15 DIAGNOSIS — Y999 Unspecified external cause status: Secondary | ICD-10-CM | POA: Diagnosis not present

## 2017-12-15 DIAGNOSIS — Y93H3 Activity, building and construction: Secondary | ICD-10-CM | POA: Insufficient documentation

## 2017-12-15 DIAGNOSIS — I10 Essential (primary) hypertension: Secondary | ICD-10-CM | POA: Diagnosis not present

## 2017-12-15 DIAGNOSIS — S61502A Unspecified open wound of left wrist, initial encounter: Secondary | ICD-10-CM | POA: Insufficient documentation

## 2017-12-15 DIAGNOSIS — Z23 Encounter for immunization: Secondary | ICD-10-CM | POA: Diagnosis not present

## 2017-12-15 DIAGNOSIS — W260XXA Contact with knife, initial encounter: Secondary | ICD-10-CM | POA: Diagnosis not present

## 2017-12-15 DIAGNOSIS — Z79899 Other long term (current) drug therapy: Secondary | ICD-10-CM | POA: Diagnosis not present

## 2017-12-15 DIAGNOSIS — S41102A Unspecified open wound of left upper arm, initial encounter: Secondary | ICD-10-CM

## 2017-12-15 MED ORDER — TETANUS-DIPHTH-ACELL PERTUSSIS 5-2.5-18.5 LF-MCG/0.5 IM SUSP
0.5000 mL | Freq: Once | INTRAMUSCULAR | Status: AC
  Administered 2017-12-15: 0.5 mL via INTRAMUSCULAR
  Filled 2017-12-15: qty 0.5

## 2017-12-15 NOTE — ED Triage Notes (Signed)
Patient c/o laceration 9 days ago and is still "painful and pulling". Wound is not bleeding/oozing.

## 2017-12-15 NOTE — Discharge Instructions (Signed)
You were seen in the ER for a wound to your arm.  This looks like it is healing properly without signs of infection.  It is normal to have some local tightness or mild discomfort as it is healing.  You can take acetaminophen or ibuprofen for discomfort.  Continue using clean water, soap and antibiotic ointment throughout the day.  Keep covered and you can apply a compression bandage while working to minimize the pulling sensation to it.  Monitor for signs of infection including redness, warmth, swelling, drainage, discharge, fevers.

## 2017-12-15 NOTE — ED Provider Notes (Signed)
[facility] EMERGENCY DEPARTMENT Provider Note   CSN: [id number] Arrival date & time: 12/15/17  0459     History   Chief Complaint Chief Complaint  Patient presents with  . Laceration    HPI John Collins is a 57 y.o. male with history of hypertension is here for evaluation of laceration to the right aspect of his ventral wrist.  Laceration onset 9 days ago.  States he was using a tile cutter that he was using to dig up tile.  He has been using hydrogen peroxide, alcohol, antibiotic ointment and wrapping it.  States that he has a pulling sensation that spread centrally towards the wound occasionally during the day worse while he is working and using his arm.  Feels like this pain is getting worse.  It is causing him stress because he can do his work as well.  He has 2/10 discomfort locally.  No fevers, chills, itching, draining, warmth, redness, swelling to the area.  No history of immunosuppression or diabetes.  Unknown tetanus status. HPI  Past Medical History:  Diagnosis Date  . Cervical radiculopathy   . Hypertension     Patient Active Problem List   Diagnosis Date Noted  . Travel advice encounter 07/02/2014  . Other and unspecified hyperlipidemia 03/01/2014  . Essential hypertension, benign 11/11/2013  . Cervical radiculopathy     Past Surgical History:  Procedure Laterality Date  . COLONOSCOPY WITH PROPOFOL N/A 03/01/2014   Procedure: COLONOSCOPY WITH PROPOFOL;  Surgeon: Garlan Fair, MD;  Location: [facility];  Service: Endoscopy;  Laterality: N/A;        Home Medications    Prior to Admission medications   Medication Sig Start Date End Date Taking? Authorizing Provider  atorvastatin (LIPITOR) 20 MG tablet Take 1 tablet (20 mg total) by mouth daily. 09/17/17   Charlott Rakes, MD  guaiFENesin (MUCINEX) 600 MG 12 hr tablet Take 1,200 mg by mouth 2 (two) times daily.    [provider]  hydrocortisone (ANUSOL-HC) 25 MG suppository  Place 1 suppository (25 mg total) rectally 2 (two) times daily. Patient not taking: Reported on 06/06/2017 05/03/14   Lorayne Marek, MD  hydrocortisone cream 1 % Apply 1 application topically 2 (two) times daily. Patient not taking: Reported on 06/06/2017 03/01/14   Lorayne Marek, MD  losartan (COZAAR) 50 MG tablet Take 1 tablet (50 mg total) by mouth daily. 09/17/17   Charlott Rakes, MD    Family History Family History  Problem Relation Age of Onset  . Thyroid disease Mother     Social History Social History   Tobacco Use  . Smoking status: Never Smoker  . Smokeless tobacco: Never Used  Substance Use Topics  . Alcohol use: Yes    Comment: rare to occ. monthly  . Drug use: No     Allergies   Lisinopril   Review of Systems Review of Systems  Skin: Positive for wound.  All other systems reviewed and are negative.    Physical Exam Updated Vital Signs BP (!) 188/99 (BP Location: Right Arm)   Pulse 62   Temp 98.4 F (36.9 C) (Oral)   Resp 16   Ht 5\' 8"  (1.727 m)   Wt 86.2 kg (190 lb)   SpO2 100%   BMI 28.89 kg/m   Physical Exam  Constitutional: He is oriented to person, place, and time. He appears well-developed and well-nourished.  Non-toxic appearance.  HENT:  Head: Normocephalic.  Right Ear: External ear normal.  Left  Ear: External ear normal.  Nose: Nose normal.  Eyes: Conjunctivae and EOM are normal.  Neck: Full passive range of motion without pain.  Cardiovascular: Normal rate.  Good cap refill to fingers bilaterally  Pulmonary/Chest: Effort normal. No tachypnea. No respiratory distress.  Musculoskeletal: Normal range of motion.  Neurological: He is alert and oriented to person, place, and time.  Sensation to light touch intact in finger tips   Skin: Skin is warm and dry. Capillary refill takes less than 2 seconds.  Circular irregular superficial wound to left ventral wrist shaped in heart shape with pink granulation tissue centrally, minimally tender.  No surrounding erythema, edema, warmth, fluctuance, drainage, induration.   Psychiatric: His behavior is normal. Thought content normal.     ED Treatments / Results  Labs (all labs ordered are listed, but only abnormal results are displayed) Labs Reviewed - No data to display  EKG None  Radiology No results found.  Procedures Procedures (including critical care time)  Medications Ordered in ED Medications  Tdap (BOOSTRIX) injection 0.5 mL (has no administration in time range)     Initial Impression / Assessment and Plan / ED Course  I have reviewed the triage vital signs and the nursing notes.  Pertinent labs & imaging results that were available during my care of the patient were reviewed by me and considered in my medical decision making (see chart for details).     Wound without signs of infection or dehiscence.  Tetanus updated today.  Extremity otherwise neurovascularly intact.  No history of immunocompromise that would delay or complicate wound healing.  No indication for suture repair as wound is 36 days old and without drainage or recurrence of bleeding.  Discussed wound care instructions at home.  Compression dressing for comfort applied.  Discussed return precautions.  Final Clinical Impressions(s) / ED Diagnoses   Final diagnoses:  Wound of left upper extremity, initial encounter    ED Discharge Orders    None       Kinnie Feil, PA-C 12/15/17 2423    Merryl Hacker, MD 12/15/17 [phone]

## 2017-12-17 ENCOUNTER — Encounter: Payer: Self-pay | Admitting: Family Medicine

## 2017-12-17 ENCOUNTER — Other Ambulatory Visit: Payer: Self-pay

## 2017-12-17 ENCOUNTER — Ambulatory Visit: Payer: Medicaid Other | Attending: Family Medicine | Admitting: Family Medicine

## 2017-12-17 VITALS — BP 139/90 | HR 60 | Temp 98.5°F | Resp 16 | Wt 205.0 lb

## 2017-12-17 DIAGNOSIS — I10 Essential (primary) hypertension: Secondary | ICD-10-CM | POA: Diagnosis not present

## 2017-12-17 DIAGNOSIS — S61512D Laceration without foreign body of left wrist, subsequent encounter: Secondary | ICD-10-CM | POA: Diagnosis not present

## 2017-12-17 DIAGNOSIS — X58XXXD Exposure to other specified factors, subsequent encounter: Secondary | ICD-10-CM | POA: Insufficient documentation

## 2017-12-17 DIAGNOSIS — S61512A Laceration without foreign body of left wrist, initial encounter: Secondary | ICD-10-CM | POA: Diagnosis not present

## 2017-12-17 DIAGNOSIS — E782 Mixed hyperlipidemia: Secondary | ICD-10-CM

## 2017-12-17 DIAGNOSIS — M5412 Radiculopathy, cervical region: Secondary | ICD-10-CM | POA: Diagnosis not present

## 2017-12-17 DIAGNOSIS — E785 Hyperlipidemia, unspecified: Secondary | ICD-10-CM | POA: Diagnosis not present

## 2017-12-17 MED ORDER — ATORVASTATIN CALCIUM 20 MG PO TABS: 20.0000 mg | ORAL_TABLET | Freq: Every day | ORAL | 1 refills | Status: DC

## 2017-12-17 MED ORDER — LOSARTAN POTASSIUM 50 MG PO TABS: 50.0000 mg | ORAL_TABLET | Freq: Every day | ORAL | 1 refills | Status: DC

## 2017-12-17 NOTE — Patient Instructions (Signed)

## 2017-12-17 NOTE — Progress Notes (Signed)
Subjective:  Patient ID: John Collins, male    DOB: [birth year]  Age: 57 y.o. MRN: [id number]  CC: Follow-up   HPI Isaid Salvia is a 57 year old male with a history of hypertension, hyperlipidemia who presents today for a follow-up visit.  He sustained a laceration to his left wrist 3 weeks ago at his place of work and was seen at the ED 2 days ago due to concerns about slow healing; tetanus administered, no antibiotic prescribed. He informs me today he thinks the laceration is healing and he has been dressing it and applying antibiotic ointment at home. He tolerates his antihypertensive and his statin and denies adverse effects from it. He denies additional concerns at this time.  Past Medical History:  Diagnosis Date  . Cervical radiculopathy   . Hypertension     Past Surgical History:  Procedure Laterality Date  . COLONOSCOPY WITH PROPOFOL N/A 03/01/2014   Procedure: COLONOSCOPY WITH PROPOFOL;  Surgeon: Garlan Fair, MD;  Location: [facility];  Service: Endoscopy;  Laterality: N/A;     Outpatient Medications Prior to Visit  Medication Sig Dispense Refill  . atorvastatin (LIPITOR) 20 MG tablet Take 1 tablet (20 mg total) by mouth daily. 90 tablet 1  . losartan (COZAAR) 50 MG tablet Take 1 tablet (50 mg total) by mouth daily. 90 tablet 1  . guaiFENesin (MUCINEX) 600 MG 12 hr tablet Take 1,200 mg by mouth 2 (two) times daily.    . hydrocortisone (ANUSOL-HC) 25 MG suppository Place 1 suppository (25 mg total) rectally 2 (two) times daily. (Patient not taking: Reported on 06/06/2017) 12 suppository 0  . hydrocortisone cream 1 % Apply 1 application topically 2 (two) times daily. (Patient not taking: Reported on 06/06/2017) 30 g 1   No facility-administered medications prior to visit.     ROS Review of Systems  Constitutional: Negative for activity change and appetite change.  HENT: Negative for sinus pressure and sore throat.   Eyes: Negative for visual disturbance.    Respiratory: Negative for cough, chest tightness and shortness of breath.   Cardiovascular: Negative for chest pain and leg swelling.  Gastrointestinal: Negative for abdominal distention, abdominal pain, constipation and diarrhea.  Endocrine: Negative.   Genitourinary: Negative for dysuria.  Musculoskeletal: Negative for joint swelling and myalgias.  Skin: Positive for wound. Negative for rash.  Allergic/Immunologic: Negative.   Neurological: Negative for weakness, light-headedness and numbness.  Psychiatric/Behavioral: Negative for dysphoric mood and suicidal ideas.    Objective:  BP 139/90 (BP Location: Left Arm, Patient Position: Sitting, Cuff Size: Large)   Pulse 60   Temp 98.5 F (36.9 C) (Oral)   Resp 16   Wt 205 lb (93 kg)   SpO2 99%   BMI 31.17 kg/m   BP/Weight 12/17/2017 12/15/2017 7/34/1937  Systolic BP 902 409 735  Diastolic BP 90 99 329  Wt. (Lbs) 205 190 196.2  BMI 31.17 28.89 28.97      Physical Exam  Constitutional: He is oriented to person, place, and time. He appears well-developed and well-nourished.  Cardiovascular: Normal rate, normal heart sounds and intact distal pulses.  No murmur heard. Pulmonary/Chest: Effort normal and breath sounds normal. He has no wheezes. He has no rales. He exhibits no tenderness.  Abdominal: Soft. Bowel sounds are normal. He exhibits no distension and no mass. There is no tenderness.  Musculoskeletal: Normal range of motion.  Neurological: He is alert and oriented to person, place, and time.  Skin: Skin is warm and dry.  Laceration  on ventral aspect of left wrist with no discharge or evidence of infection  Psychiatric: He has a normal mood and affect.     CMP Latest Ref Rng & Units 09/17/2017 06/06/2017 06/01/2014  Glucose 65 - 99 mg/dL 101(H) 130(H) 77  BUN 6 - 24 mg/dL _0 Creatinine 0.76 - 1.27 mg/dL 0.78 1.13 0.95  Sodium 134 - 144 mmol/L 143 141 139  Potassium 3.5 - 5.2 mmol/L 4.1 4.0 4.3  Chloride 96 - 106  mmol/L 103 102 104  CO2 20 - 29 mmol/L _1 Calcium 8.7 - 10.2 mg/dL 9.6 9.3 9.4  Total Protein 6.0 - 8.5 g/dL 7.7 7.1 7.4  Total Bilirubin 0.0 - 1.2 mg/dL 0.4 0.4 0.3  Alkaline Phos 39 - 117 IU/L 93 92 73  AST 0 - 40 IU/L _2 ALT 0 - 44 IU/L 17 32 23    Lipid Panel     Component Value Date/Time   CHOL 255 (H) 09/17/2017 1037   TRIG 100 09/17/2017 1037   HDL 52 09/17/2017 1037   CHOLHDL 4.9 09/17/2017 1037   CHOLHDL 4.2 11/11/2013 1623   VLDL 18 11/11/2013 1623   LDLCALC 183 (H) 09/17/2017 1037    Assessment & Plan:   1. Essential hypertension, benign Controlled Counseled on blood pressure goal of less than 130/80, low-sodium, DASH diet, medication compliance, 150 minutes of moderate intensity exercise per week. Discussed medication compliance, adverse effects. - losartan (COZAAR) 50 MG tablet; Take 1 tablet (50 mg total) by mouth daily.  Dispense: 90 tablet; Refill: 1 - CMP14+EGFR - Lipid panel  2. Mixed hyperlipidemia Uncontrolled Low-cholesterol diet, lifestyle modifications - atorvastatin (LIPITOR) 20 MG tablet; Take 1 tablet (20 mg total) by mouth daily.  Dispense: 90 tablet; Refill: 1  3. Laceration of left wrist, subsequent encounter No evidence of infection Dressing change applied in clinic   Meds ordered this encounter  Medications  . losartan (COZAAR) 50 MG tablet    Sig: Take 1 tablet (50 mg total) by mouth daily.    Dispense:  90 tablet    Refill:  1  . atorvastatin (LIPITOR) 20 MG tablet    Sig: Take 1 tablet (20 mg total) by mouth daily.    Dispense:  90 tablet    Refill:  1    Follow-up: Return in about 6 months (around 06/19/2018) for Follow-up of chronic medical conditions.   Charlott Rakes MD

## 2017-12-17 NOTE — Progress Notes (Signed)
Follow up: HTN Wound on lt wrist x 3 weeks

## 2017-12-18 LAB — CMP14+EGFR
ALK PHOS: 91 IU/L (ref 39–117)
ALT: 20 IU/L (ref 0–44)
AST: 21 IU/L (ref 0–40)
Albumin/Globulin Ratio: 1.6 (ref 1.2–2.2)
Albumin: 4.4 g/dL (ref 3.5–5.5)
BUN/Creatinine Ratio: 10 (ref 9–20)
BUN: 8 mg/dL (ref 6–24)
Bilirubin Total: 0.5 mg/dL (ref 0.0–1.2)
CO2: 21 mmol/L (ref 20–29)
CREATININE: 0.78 mg/dL (ref 0.76–1.27)
Calcium: 9.1 mg/dL (ref 8.7–10.2)
Chloride: 103 mmol/L (ref 96–106)
GFR calc non Af Amer: 101 mL/min/{1.73_m2} (ref >59)
GFR, EST AFRICAN AMERICAN: 117 mL/min/{1.73_m2} (ref >59)
GLOBULIN, TOTAL: 2.7 g/dL (ref 1.5–4.5)
GLUCOSE: 110 mg/dL — AB (ref 65–99)
Potassium: 4.3 mmol/L (ref 3.5–5.2)
SODIUM: 142 mmol/L (ref 134–144)
TOTAL PROTEIN: 7.1 g/dL (ref 6.0–8.5)

## 2017-12-18 LAB — LIPID PANEL
CHOL/HDL RATIO: 3.2 ratio (ref 0.0–5.0)
Cholesterol, Total: 177 mg/dL (ref 100–199)
HDL: 55 mg/dL (ref >39)
LDL CALC: 103 mg/dL — AB (ref 0–99)
Triglycerides: 97 mg/dL (ref 0–149)
VLDL CHOLESTEROL CAL: 19 mg/dL (ref 5–40)

## 2017-12-19 ENCOUNTER — Other Ambulatory Visit: Payer: Self-pay | Admitting: Family Medicine

## 2017-12-19 ENCOUNTER — Telehealth: Payer: Self-pay | Admitting: Family Medicine

## 2017-12-19 NOTE — Telephone Encounter (Signed)
Patient was called and informed of letter being ready for pick up. 

## 2017-12-19 NOTE — Telephone Encounter (Signed)
Patient called stating that he needs a letter saying on the 22 can he go back without restriction.

## 2017-12-19 NOTE — Telephone Encounter (Signed)
Ready for pick up

## 2017-12-23 ENCOUNTER — Ambulatory Visit (HOSPITAL_COMMUNITY)
Admission: EM | Admit: 2017-12-23 | Discharge: 2017-12-23 | Disposition: A | Discharge status: Home / Self Care | Payer: Medicaid Other | Attending: Family Medicine | Admitting: Family Medicine

## 2017-12-23 ENCOUNTER — Encounter: Payer: Self-pay | Admitting: Family Medicine

## 2017-12-23 ENCOUNTER — Encounter (HOSPITAL_COMMUNITY): Payer: Self-pay | Admitting: Emergency Medicine

## 2017-12-23 DIAGNOSIS — S61512A Laceration without foreign body of left wrist, initial encounter: Secondary | ICD-10-CM

## 2017-12-23 MED ORDER — SULFAMETHOXAZOLE-TRIMETHOPRIM 800-160 MG PO TABS: 1.0000 | ORAL_TABLET | Freq: Two times a day (BID) | ORAL | 0 refills | Status: AC

## 2017-12-23 NOTE — ED Provider Notes (Signed)
[facility]   [id number] 12/23/17 Arrival Time: 1191  ASSESSMENT & PLAN:  1. Wrist laceration, left, initial encounter     Meds ordered this encounter  Medications  . sulfamethoxazole-trimethoprim (BACTRIM DS,SEPTRA DS) 800-160 MG tablet    Sig: Take 1 tablet by mouth 2 (two) times daily for 10 days.    Dispense:  20 tablet    Refill:  0   Healing well with questionable superficial infection. Will start antibiotic today.  Follow-up Information    Charlott Rakes, MD.   Specialty:  Family Medicine Why:  As needed. Contact information: [place] [address] [phone]           Reviewed expectations re: course of current medical issues. Questions answered. Outlined signs and symptoms indicating need for more acute intervention. Patient verbalized understanding. After Visit Summary given.   SUBJECTIVE:  John Collins is a 57 y.o. male who presents with a laceration of his L wrist approx 2 weeks ago. Has been healing. Seen in ED last week. Has been using topical ointment. Here for wound check. Questions superficial infection. Normal wrist ROM. No extremity sensation changes or weakness.  Td was updated in the ED.  ROS: As per HPI.   OBJECTIVE:  Vitals:   12/23/17 1019 12/23/17 1028  BP: (!) 180/96 121/86  Pulse: (!) 104 75  Resp: 18 18  Temp: 98.3 F (36.8 C) 98.3 F (36.8 C)  TempSrc: Oral Oral  SpO2: 99% 100%     General appearance: alert; no distress Skin: healing wound of his L inner wrist; superficial yellow crusting with some yellow papules; no drainage; non-tender; wrist with FROM Psychological: alert and cooperative; normal mood and affect    Allergies  Allergen Reactions  . Lisinopril     Cough     Past Medical History:  Diagnosis Date  . Cervical radiculopathy   . Hypertension    Social History   Socioeconomic History  . Marital status: Married    Spouse name: Not on file  . Number of  children: Not on file  . Years of education: Not on file  . Highest education level: Not on file  Occupational History  . Not on file  Social Needs  . Financial resource strain: Not on file  . Food insecurity:    Worry: Not on file    Inability: Not on file  . Transportation needs:    Medical: Not on file    Non-medical: Not on file  Tobacco Use  . Smoking status: Never Smoker  . Smokeless tobacco: Never Used  Substance and Sexual Activity  . Alcohol use: Yes    Comment: rare to occ. monthly  . Drug use: No  . Sexual activity: Not on file  Lifestyle  . Physical activity:    Days per week: Not on file    Minutes per session: Not on file  . Stress: Not on file  Relationships  . Social connections:    Talks on phone: Not on file    Gets together: Not on file    Attends religious service: Not on file    Active member of club or organization: Not on file    Attends meetings of clubs or organizations: Not on file    Relationship status: Not on file  Other Topics Concern  . Not on file  Social History Narrative   He lives with 2 daughter, native of [place], immigrate to [place] 13 years ago, does not work now.  Vanessa Kick, MD 12/23/17 1051

## 2017-12-23 NOTE — ED Triage Notes (Signed)
PT has an old laceration to left wrist that happened 2 weeks ago. PT is concerned about how it is healing. PT reports area is tight and uncomfortable. PT was seen at ER for same 1 week ago.  PT has new bumps/ rash to area after using antibiotic cream.

## 2017-12-25 ENCOUNTER — Ambulatory Visit: Payer: Medicaid Other | Attending: Family Medicine | Admitting: Family Medicine

## 2017-12-25 ENCOUNTER — Encounter: Payer: Self-pay | Admitting: Family Medicine

## 2017-12-25 VITALS — BP 150/78 | HR 85 | Temp 97.8°F | Ht 69.0 in | Wt 204.8 lb

## 2017-12-25 DIAGNOSIS — M5412 Radiculopathy, cervical region: Secondary | ICD-10-CM | POA: Diagnosis not present

## 2017-12-25 DIAGNOSIS — Z888 Allergy status to other drugs, medicaments and biological substances status: Secondary | ICD-10-CM | POA: Diagnosis not present

## 2017-12-25 DIAGNOSIS — X58XXXD Exposure to other specified factors, subsequent encounter: Secondary | ICD-10-CM | POA: Diagnosis not present

## 2017-12-25 DIAGNOSIS — S61512D Laceration without foreign body of left wrist, subsequent encounter: Secondary | ICD-10-CM

## 2017-12-25 DIAGNOSIS — E785 Hyperlipidemia, unspecified: Secondary | ICD-10-CM | POA: Diagnosis not present

## 2017-12-25 DIAGNOSIS — I10 Essential (primary) hypertension: Secondary | ICD-10-CM | POA: Diagnosis not present

## 2017-12-25 DIAGNOSIS — Z79899 Other long term (current) drug therapy: Secondary | ICD-10-CM | POA: Insufficient documentation

## 2017-12-25 NOTE — Progress Notes (Signed)
Subjective:  Patient ID: John Collins, male    DOB: 03-16-1961  Age: 57 y.o. MRN: [id number]  CC: Extremity Laceration   HPI John Collins is a 57 year old male with a history of hypertension, hyperlipidemia who presents today for a follow-up visit.  He was seen at the ED on 12/15/17 due to left wrist laceration tetanus administered, no antibiotic prescribed and he has been dressing it with a nonadherent dressing but noticed appearance of bumps around the scab for which he had to be seen at urgent care 2 days ago as he was unable to obtain an appointment here and was prescribed Bactrim which he has been taking. He informs me FMLA paperwork will be sent from his office and he will need that completed indicating he had been out of work from 12/16/2017 through 12/20/2017 and return to work on 12/23/2017.  He denies fevers.  Past Medical History:  Diagnosis Date  . Cervical radiculopathy   . Hypertension     Past Surgical History:  Procedure Laterality Date  . COLONOSCOPY WITH PROPOFOL N/A 03/01/2014   Procedure: COLONOSCOPY WITH PROPOFOL;  Surgeon: John Fair, MD;  Location: [facility];  Service: Endoscopy;  Laterality: N/A;    Allergies  Allergen Reactions  . Lisinopril     Cough      Outpatient Medications Prior to Visit  Medication Sig Dispense Refill  . atorvastatin (LIPITOR) 20 MG tablet Take 1 tablet (20 mg total) by mouth daily. 90 tablet 1  . losartan (COZAAR) 50 MG tablet Take 1 tablet (50 mg total) by mouth daily. 90 tablet 1  . sulfamethoxazole-trimethoprim (BACTRIM DS,SEPTRA DS) 800-160 MG tablet Take 1 tablet by mouth 2 (two) times daily for 10 days. 20 tablet 0   No facility-administered medications prior to visit.     ROS Review of Systems  Constitutional: Negative for activity change and appetite change.  HENT: Negative for sinus pressure and sore throat.   Eyes: Negative for visual disturbance.  Respiratory: Negative for cough, chest tightness and  shortness of breath.   Cardiovascular: Negative for chest pain and leg swelling.  Gastrointestinal: Negative for abdominal distention, abdominal pain, constipation and diarrhea.  Endocrine: Negative.   Genitourinary: Negative for dysuria.  Musculoskeletal: Negative for joint swelling and myalgias.  Skin:       See hpi  Allergic/Immunologic: Negative.   Neurological: Negative for weakness, light-headedness and numbness.  Psychiatric/Behavioral: Negative for dysphoric mood and suicidal ideas.    Objective:  BP (!) 150/78   Pulse 85   Temp 97.8 F (36.6 C) (Oral)   Ht 5\' 9"  (1.753 m)   Wt 204 lb 12.8 oz (92.9 kg)   SpO2 95%   BMI 30.24 kg/m   BP/Weight 12/25/2017 12/23/2017 2/67/1245  Systolic BP 809 983 382  Diastolic BP 78 86 90  Wt. (Lbs) 204.8 - 205  BMI 30.24 - 31.17      Physical Exam  Constitutional: He is oriented to person, place, and time. He appears well-developed and well-nourished.  Cardiovascular: Normal rate, normal heart sounds and intact distal pulses.  No murmur heard. Pulmonary/Chest: Effort normal and breath sounds normal. He has no wheezes. He has no rales. He exhibits no tenderness.  Abdominal: Soft. Bowel sounds are normal. He exhibits no distension and no mass. There is no tenderness.  Musculoskeletal: Normal range of motion.  Neurological: He is alert and oriented to person, place, and time.  Skin:  Left wrist with scab oval laceration and surrounding fluid-filled rash with  clear exudate.     Assessment & Plan:   1. Laceration of left wrist, subsequent encounter Superimposed infection likely from reaction to dressing material Continue bacitracin  2. Essential hypertension Blood pressure is elevated today but was normal at his last visit Elevation could be from the fact that he has his baby with him We will make no regimen changes and reassess at next visit Counseled on blood pressure goal of less than 130/80, low-sodium, DASH diet,  medication compliance, 150 minutes of moderate intensity exercise per week. Discussed medication compliance, adverse effects.    No orders of the defined types were placed in this encounter.   Follow-up: Return for [place] up of chronic medical conditions, keep previously scheduled appointment.John Rakes MD

## 2018-03-03 DIAGNOSIS — Z23 Encounter for immunization: Secondary | ICD-10-CM | POA: Diagnosis not present

## 2018-03-12 DIAGNOSIS — D3102 Benign neoplasm of left conjunctiva: Secondary | ICD-10-CM | POA: Diagnosis not present

## 2018-07-27 ENCOUNTER — Emergency Department (HOSPITAL_COMMUNITY)
Admission: EM | Admit: 2018-07-27 | Discharge: 2018-07-27 | Disposition: A | Discharge status: Home / Self Care | Payer: 59 | Attending: Emergency Medicine | Admitting: Emergency Medicine

## 2018-07-27 ENCOUNTER — Encounter (HOSPITAL_COMMUNITY): Payer: Self-pay

## 2018-07-27 ENCOUNTER — Emergency Department (HOSPITAL_COMMUNITY): Payer: 59

## 2018-07-27 DIAGNOSIS — M7711 Lateral epicondylitis, right elbow: Secondary | ICD-10-CM | POA: Insufficient documentation

## 2018-07-27 DIAGNOSIS — Z79899 Other long term (current) drug therapy: Secondary | ICD-10-CM | POA: Diagnosis not present

## 2018-07-27 DIAGNOSIS — I10 Essential (primary) hypertension: Secondary | ICD-10-CM | POA: Diagnosis not present

## 2018-07-27 DIAGNOSIS — M25521 Pain in right elbow: Secondary | ICD-10-CM | POA: Diagnosis not present

## 2018-07-27 NOTE — ED Notes (Signed)
Patient verbalized understanding of dc instructions, vss, ambulatory with nad.   

## 2018-07-27 NOTE — ED Notes (Signed)
Patient transported to X-ray 

## 2018-07-27 NOTE — ED Provider Notes (Signed)
[facility] EMERGENCY DEPARTMENT Provider Note   CSN: [id number] Arrival date & time: 07/27/18  9702    History   Chief Complaint Chief Complaint  Patient presents with  . Elbow Pain    HPI John Collins is a 58 y.o. male who presents with R elbow pain.  Past medical history significant for hypertension and cervical radiculopathy.  Patient states that over the past month he has had gradually worsening pain to his right elbow.  The pain is over the lateral aspect.  Pain is intermittent and worse with certain movements.  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  He has never had this problem before.  Nothing makes it better.  No therapies tried prior to arrival.  He has not had any trauma.  No redness or swelling.   HPI  Past Medical History:  Diagnosis Date  . Cervical radiculopathy   . Hypertension     Patient Active Problem List   Diagnosis Date Noted  . Travel advice encounter 07/02/2014  . Other and unspecified hyperlipidemia 03/01/2014  . Essential hypertension, benign 11/11/2013  . Cervical radiculopathy     Past Surgical History:  Procedure Laterality Date  . COLONOSCOPY WITH PROPOFOL N/A 03/01/2014   Procedure: COLONOSCOPY WITH PROPOFOL;  Surgeon: Garlan Fair, MD;  Location: [facility];  Service: Endoscopy;  Laterality: N/A;        Home Medications    Prior to Admission medications   Medication Sig Start Date End Date Taking? Authorizing Provider  atorvastatin (LIPITOR) 20 MG tablet Take 1 tablet (20 mg total) by mouth daily. 12/17/17   Charlott Rakes, MD  losartan (COZAAR) 50 MG tablet Take 1 tablet (50 mg total) by mouth daily. 12/17/17   Charlott Rakes, MD    Family History Family History  Problem Relation Age of Onset  . Thyroid disease Mother     Social History Social History   Tobacco Use  . Smoking status: Never Smoker  . Smokeless tobacco: Never Used  Substance Use Topics   . Alcohol use: Yes    Comment: rare to occ. monthly  . Drug use: No     Allergies   Lisinopril   Review of Systems Review of Systems  Constitutional: Negative for fever.  Musculoskeletal: Positive for arthralgias. Negative for joint swelling.  Neurological: Negative for weakness and numbness.     Physical Exam Updated Vital Signs BP 138/83   Pulse (!) 54   Temp (!) 97.5 F (36.4 C) (Oral)   Resp 20   SpO2 97%   Physical Exam Vitals signs and nursing note reviewed.  Constitutional:      General: He is not in acute distress.    Appearance: Normal appearance. He is well-developed.  HENT:     Head: Normocephalic and atraumatic.  Eyes:     General: No scleral icterus.       Right eye: No discharge.        Left eye: No discharge.     Conjunctiva/sclera: Conjunctivae normal.     Pupils: Pupils are equal, round, and reactive to light.  Neck:     Musculoskeletal: Normal range of motion.  Cardiovascular:     Rate and Rhythm: Normal rate.  Pulmonary:     Effort: Pulmonary effort is normal. No respiratory distress.  Abdominal:     General: There is no distension.  Musculoskeletal:     Comments: R elbow: No obvious swelling,  deformity, or warmth. Tenderness to palpation over lateral aspect of elbow. FROM. 5/5 strength. N/V intact.   Skin:    General: Skin is warm and dry.  Neurological:     Mental Status: He is alert and oriented to person, place, and time.  Psychiatric:        Behavior: Behavior normal.      ED Treatments / Results  Labs (all labs ordered are listed, but only abnormal results are displayed) Labs Reviewed - No data to display  EKG None  Radiology No results found.  Procedures Procedures (including critical care time)  Medications Ordered in ED Medications - No data to display   Initial Impression / Assessment and Plan / ED Course  I have reviewed the triage vital signs and the nursing notes.  Pertinent labs & imaging results that  were available during my care of the patient were reviewed by me and considered in my medical decision making (see chart for details).  58 year old male presents with gradually worsening right elbow pain.  He is mildly bradycardic but otherwise vital signs are normal.  He is reproducible tenderness over the lateral epicondyles.  Suspect lateral epicondylitis.  He does not have any swelling or warmth to suggest infection or inflammation.  He is full range of motion of the elbow.  2+ radial pulse.  Discussed with patient that imaging is not necessarily needed but he prefers to have an x-ray of of the area.  Will order this.  Xray is normal. Advised rest, ice, brace, NSAIDs and f/u with his PCP vs ortho  Final Clinical Impressions(s) / ED Diagnoses   Final diagnoses:  Lateral epicondylitis of right elbow    ED Discharge Orders    None       Recardo Evangelist, PA-C 07/27/18 [place], April, MD 07/27/18 [phone]

## 2018-07-27 NOTE — Discharge Instructions (Signed)
Take Ibuprofen for pain Wear a brace for tennis elbow - you can get this at any pharmacy You can apply ice to the area for pain relief Please follow up with your doctor or orthopedics

## 2018-07-27 NOTE — ED Triage Notes (Signed)
Pt reports pain to the R elbow for the past several weeks, pain is worse tonight denies injury, full ROM

## 2018-07-29 ENCOUNTER — Other Ambulatory Visit: Payer: Self-pay | Admitting: Family Medicine

## 2018-07-29 DIAGNOSIS — E782 Mixed hyperlipidemia: Secondary | ICD-10-CM

## 2018-07-31 ENCOUNTER — Ambulatory Visit: Payer: 59 | Attending: Critical Care Medicine | Admitting: Critical Care Medicine

## 2018-07-31 ENCOUNTER — Other Ambulatory Visit: Payer: Self-pay

## 2018-07-31 ENCOUNTER — Encounter: Payer: Self-pay | Admitting: Critical Care Medicine

## 2018-07-31 VITALS — BP 174/83 | HR 89 | Temp 98.0°F | Resp 17 | Wt 206.8 lb

## 2018-07-31 DIAGNOSIS — M7711 Lateral epicondylitis, right elbow: Secondary | ICD-10-CM | POA: Diagnosis not present

## 2018-07-31 DIAGNOSIS — I10 Essential (primary) hypertension: Secondary | ICD-10-CM

## 2018-07-31 DIAGNOSIS — E782 Mixed hyperlipidemia: Secondary | ICD-10-CM

## 2018-07-31 MED ORDER — IBUPROFEN 600 MG PO TABS: 600.0000 mg | ORAL_TABLET | Freq: Three times a day (TID) | ORAL | 0 refills | Status: DC | PRN

## 2018-07-31 MED ORDER — ATORVASTATIN CALCIUM 20 MG PO TABS: 20.0000 mg | ORAL_TABLET | Freq: Every day | ORAL | 1 refills | Status: DC

## 2018-07-31 MED ORDER — LOSARTAN POTASSIUM-HCTZ 100-12.5 MG PO TABS: 1.0000 | ORAL_TABLET | Freq: Every day | ORAL | 3 refills | Status: DC

## 2018-07-31 NOTE — Patient Instructions (Signed)
A referral to orthopedic surgery Dr. Alphonzo Severance to evaluate your elbow was made  You can take ibuprofen 600 mg every 8 hours as needed for pain and a prescription for this was sent to your local pharmacy  Refill on atorvastatin was sent to your local pharmacy continue taking this  Discontinue losartan and begin losartan HCT 100/12-1/2 mg 1 tablet daily  A letter a letter keeping you out of work for 2 weeks to rest your elbow will be prepared  Return to see your primary care physician for follow-up in the next 4 months

## 2018-07-31 NOTE — Progress Notes (Signed)
Subjective:    Patient ID: John Collins, male    DOB: [birth year], 58 y.o.   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  He noted over the past 2 weeks increasing pain in the lateral aspect of his right elbow.  Subsequently patient went to the emergency room on February 23 for the symptoms.  X-ray of the elbow was normal.  He had reproducible tenderness over the lateral epicondyle of the right elbow.  Suspicion for lateral epicondylitis was made.  The patient was recommended to rest his elbow apply ice brace and nonsteroidals.  The patient returns today for follow-up.  Note he has pre-existing history of hypertension and hyperlipidemia on atorvastatin.  He is only on losartan 50 mg daily for blood pressure.  Note his blood pressures have been noted to be ranging anywhere from today 174/83 up to 170/110.  The patient is considering Copy as his company is not work with him as of yet.   Past Medical History:  Diagnosis Date  . Cervical radiculopathy   . Hypertension      Family History  Problem Relation Age of Onset  . Thyroid disease Mother      Social History   Socioeconomic History  . Marital status: Married    Spouse name: Not on file  . Number of children: Not on file  . Years of education: Not on file  . Highest education level: Not on file  Occupational History  . Not on file  Social Needs  . Financial resource strain: Not on file  . Food insecurity:    Worry: Not on file    Inability: Not on file  . Transportation needs:    Medical: Not on file    Non-medical: Not on file  Tobacco Use  . Smoking status: Never Smoker  . Smokeless tobacco: Never Used  Substance and Sexual Activity  . Alcohol use: Yes    Comment: rare to occ. monthly  . Drug use: No  . Sexual activity: Not on file    Lifestyle  . Physical activity:    Days per week: Not on file    Minutes per session: Not on file  . Stress: Not on file  Relationships  . Social connections:    Talks on phone: Not on file    Gets together: Not on file    Attends religious service: Not on file    Active member of club or organization: Not on file    Attends meetings of clubs or organizations: Not on file    Relationship status: Not on file  . Intimate partner violence:    Fear of current or ex partner: Not on file    Emotionally abused: Not on file    Physically abused: Not on file    Forced sexual activity: Not on file  Other Topics Concern  . Not on file  Social History Narrative   He lives with 2 daughter, native of [place], immigrate to [place] 13 years ago, does not work now.     Allergies  Allergen Reactions  . Lisinopril     Cough      Outpatient Medications Prior to Visit  Medication Sig Dispense Refill  . atorvastatin (LIPITOR) 20 MG tablet Take 1 tablet (20 mg total) by mouth daily. 90 tablet  1  . losartan (COZAAR) 50 MG tablet Take 1 tablet (50 mg total) by mouth daily. 90 tablet 1   No facility-administered medications prior to visit.     Review of Systems  Musculoskeletal: Negative for arthralgias, joint swelling, neck pain and neck stiffness.       Right lateral elbow pain  All other systems reviewed and are negative.      Objective:   Physical Exam Vitals:   07/31/18 1447  BP: (!) 174/83  Pulse: 89  Resp: 17  Temp: 98 F (36.7 C)  TempSrc: Oral  SpO2: 98%    Gen: Pleasant, well-nourished, in no distress,  normal affect  ENT: No lesions,  mouth clear,  oropharynx clear, no postnasal drip  Neck: No JVD, no TMG, no carotid bruits  Lungs: No use of accessory muscles, no dullness to percussion, clear without rales or rhonchi  Cardiovascular: RRR, heart sounds normal, no murmur or gallops, no peripheral edema  Abdomen: soft and NT, no HSM,  BS normal  Musculoskeletal: No  deformities, no cyanosis or clubbing Right lateral elbow point tenderness and swelling   Neuro: alert, non focal  Skin: Warm, no lesions or rashes Xray Right elbow: negative 2/23 negative for blood    Assessment & Plan:  I personally reviewed all images and lab data in the [facility] system as well as any outside material available during this office visit and agree with the  radiology impressions.   Essential hypertension, benign History of hypertension not well controlled on 50 mg daily of losartan  Plan will be to change to losartan 100 mg combined with HCTZ 12-1/2 mg to take daily  Lateral epicondylitis of right elbow The patient has evidence for lateral epicondylitis of the right elbow  Plain films of the elbow were unremarkable for any fracture  This is likely work-related given the type of work he does at the [employer] will be to refer to orthopedics for further evaluation and treatment and in the interim will take the patient out of work for 2 weeks and apply brace and begin ibuprofen 600 mg 3 times daily as needed    Mixed hyperlipidemia Mixed hyperlipidemia which is well controlled now on atorvastatin will maintain this and refill this medication   [place] was seen today for hospitalization follow-up.  Diagnoses and all orders for this visit:  Lateral epicondylitis of right elbow -     Ambulatory referral to Orthopedic Surgery  Mixed hyperlipidemia -     atorvastatin (LIPITOR) 20 MG tablet; Take 1 tablet (20 mg total) by mouth daily.  Essential hypertension, benign  Other orders -     losartan-hydrochlorothiazide (HYZAAR) 100-12.5 MG tablet; Take 1 tablet by mouth daily. -     ibuprofen (ADVIL,MOTRIN) 600 MG tablet; Take 1 tablet (600 mg total) by mouth every 8 (eight) hours as needed.   Note this patient did receive a flu vaccine at a local pharmacy in September 2019

## 2018-07-31 NOTE — Assessment & Plan Note (Signed)
Mixed hyperlipidemia which is well controlled now on atorvastatin will maintain this and refill this medication

## 2018-07-31 NOTE — Progress Notes (Signed)
Patient presents to clinic with right elboew pain.  Apptointment is a hospital follow up.

## 2018-07-31 NOTE — Assessment & Plan Note (Signed)
History of hypertension not well controlled on 50 mg daily of losartan  Plan will be to change to losartan 100 mg combined with HCTZ 12-1/2 mg to take daily

## 2018-07-31 NOTE — Assessment & Plan Note (Signed)
The patient has evidence for lateral epicondylitis of the right elbow  Plain films of the elbow were unremarkable for any fracture  This is likely work-related given the type of work he does at the [employer] will be to refer to orthopedics for further evaluation and treatment and in the interim will take the patient out of work for 2 weeks and apply brace and begin ibuprofen 600 mg 3 times daily as needed

## 2018-08-06 ENCOUNTER — Ambulatory Visit (INDEPENDENT_AMBULATORY_CARE_PROVIDER_SITE_OTHER): Payer: 59 | Admitting: Orthopedic Surgery

## 2018-08-06 ENCOUNTER — Encounter (INDEPENDENT_AMBULATORY_CARE_PROVIDER_SITE_OTHER): Payer: Self-pay | Admitting: Orthopedic Surgery

## 2018-08-06 DIAGNOSIS — M7711 Lateral epicondylitis, right elbow: Secondary | ICD-10-CM

## 2018-08-06 DIAGNOSIS — M79601 Pain in right arm: Secondary | ICD-10-CM | POA: Diagnosis not present

## 2018-08-06 MED ORDER — PREDNISONE 5 MG (21) PO TBPK: ORAL_TABLET | ORAL | 0 refills | Status: DC

## 2018-08-06 MED ORDER — TRAMADOL HCL 50 MG PO TABS: ORAL_TABLET | ORAL | 0 refills | Status: DC

## 2018-08-06 NOTE — Progress Notes (Signed)
Office Visit Note   Patient: John Collins           Date of Birth: 14-Dec-1960           MRN: [id number] Visit Date: 08/06/2018 Requested by: Elsie Stain, MD [address], [place] [address] PCP: Charlott Rakes, MD  Subjective: Chief Complaint  Patient presents with  . Right Elbow - Pain    HPI: Patient presents with acute worsening of longstanding right elbow pain.  On February 23 he developed significant pain in that arm.  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  Is actually been hard for him to sleep at night.  He is taking ibuprofen without help and also used a tennis elbow brace without help.  He denies much in the way of neck pain but he does describe paresthesias in the arm radiating down to the dorsal wrist.              ROS: All systems reviewed are negative as they relate to the chief complaint within the history of present illness.  Patient denies  fevers or chills.   Assessment & Plan: Visit Diagnoses:  1. Pain of right upper extremity   2. Right tennis elbow     Plan: Impression is right elbow and arm pain.  This could be fairly severe lateral epicondylitis versus posterior interosseous nerve compression versus radicular pain from the neck.  He does have fairly constant pain on a daily basis and it is hard for him to sleep.  This seems a little bit more severe than normal tennis elbow.  Plan is Medrol Dosepak with tramadol to be taken at night.  Also want again nerve conduction study to evaluate for radial nerve compression.  I will see him back after that intervention.  Do not really want to do cortisone shot as they have only shown short-term relief.  If the nerve study is negative I would probably go with MRI scanning next just to really define the soft tissue anatomy of the elbow.  Follow-Up Instructions: No follow-ups on file.   Orders:  Orders Placed This Encounter  Procedures  . Ambulatory referral to Physical Medicine Rehab   Meds  ordered this encounter  Medications  . traMADol (ULTRAM) 50 MG tablet    Sig: 1 po q hs prn pain    Dispense:  30 tablet    Refill:  0  . predniSONE (STERAPRED UNI-PAK 21 TAB) 5 MG (21) TBPK tablet    Sig: Take dosepak as directed    Dispense:  21 tablet    Refill:  0      Procedures: No procedures performed   Clinical Data: No additional findings.  Objective: Vital Signs: There were no vitals taken for this visit.  Physical Exam:   Constitutional: Patient appears well-developed HEENT:  Head: Normocephalic Eyes:EOM are normal Neck: Normal range of motion Cardiovascular: Normal rate Pulmonary/chest: Effort normal Neurologic: Patient is alert Skin: Skin is warm Psychiatric: Patient has normal mood and affect    Ortho Exam: Ortho exam demonstrates full active and passive range of motion of the right elbow.  Has full active and passive range of motion of the neck.  Radial pulses intact bilaterally.  No masses lymphadenopathy or skin changes noted in that neck region.  Does have a little bit of pain with resisted elbow extension and resisted wrist extension over that lateral epicondyle.  No biceps tendon tenderness and no medial sided tenderness with  no subluxation of the ulnar nerve.  Has tenderness in the supinator arch region as well.  But no pain with resisted supination or resisted flexion in that right arm.  No definite paresthesias C5-T1 with symmetric reflexes bilateral biceps and triceps.  Specialty Comments:  No specialty comments available.  Imaging: No results found.   PMFS History: Patient Active Problem List   Diagnosis Date Noted  . Lateral epicondylitis of right elbow 07/31/2018  . Mixed hyperlipidemia 03/01/2014  . Essential hypertension, benign 11/11/2013   Past Medical History:  Diagnosis Date  . Cervical radiculopathy   . Hypertension     Family History  Problem Relation Age of Onset  . Thyroid disease Mother     Past Surgical History:    Procedure Laterality Date  . COLONOSCOPY WITH PROPOFOL N/A 03/01/2014   Procedure: COLONOSCOPY WITH PROPOFOL;  Surgeon: Garlan Fair, MD;  Location: [facility];  Service: Endoscopy;  Laterality: N/A;   Social History   Occupational History  . Not on file  Tobacco Use  . Smoking status: Never Smoker  . Smokeless tobacco: Never Used  Substance and Sexual Activity  . Alcohol use: Yes    Comment: rare to occ. monthly  . Drug use: No  . Sexual activity: Not on file

## 2018-08-11 ENCOUNTER — Telehealth: Payer: Self-pay | Admitting: Family Medicine

## 2018-08-11 ENCOUNTER — Encounter: Payer: Self-pay | Admitting: Family Medicine

## 2018-08-11 NOTE — Telephone Encounter (Signed)
Pt came in wanting to know th date he shall return to work pt stats he was advised 2 weeks but he has appointment  2 days after his advised return date would like a follow up

## 2018-08-11 NOTE — Telephone Encounter (Signed)
Patient was taken out of work by Dr.Wright on 07/31/2018. Please follow up.

## 2018-08-11 NOTE — Telephone Encounter (Signed)
I sent him a my chart message regarding this.  Dr. Bettina Gavia note put him out of work for 2 weeks with no return to work date.  If this is a workers comp case he will need to follow-up with orthopedics, Dr. Marlou Sa whom he saw on 08/06/2018 regarding return.  If he feels able to return to work then he will return 2 weeks from the date the note was written for him.

## 2018-08-20 ENCOUNTER — Encounter (INDEPENDENT_AMBULATORY_CARE_PROVIDER_SITE_OTHER): Payer: Self-pay | Admitting: Physical Medicine and Rehabilitation

## 2018-08-20 ENCOUNTER — Telehealth (INDEPENDENT_AMBULATORY_CARE_PROVIDER_SITE_OTHER): Payer: Self-pay | Admitting: Orthopedic Surgery

## 2018-08-20 ENCOUNTER — Telehealth: Payer: Self-pay

## 2018-08-20 ENCOUNTER — Other Ambulatory Visit: Payer: Self-pay

## 2018-08-20 ENCOUNTER — Ambulatory Visit: Payer: 59 | Attending: Family Medicine | Admitting: Family Medicine

## 2018-08-20 ENCOUNTER — Telehealth (INDEPENDENT_AMBULATORY_CARE_PROVIDER_SITE_OTHER): Payer: Self-pay | Admitting: *Deleted

## 2018-08-20 ENCOUNTER — Encounter: Payer: Self-pay | Admitting: Family Medicine

## 2018-08-20 ENCOUNTER — Ambulatory Visit (INDEPENDENT_AMBULATORY_CARE_PROVIDER_SITE_OTHER): Payer: 59 | Admitting: Physical Medicine and Rehabilitation

## 2018-08-20 VITALS — Temp 97.8°F

## 2018-08-20 VITALS — BP 150/90 | HR 98 | Temp 98.3°F | Ht 69.0 in | Wt 204.0 lb

## 2018-08-20 DIAGNOSIS — R202 Paresthesia of skin: Secondary | ICD-10-CM | POA: Diagnosis not present

## 2018-08-20 DIAGNOSIS — M7711 Lateral epicondylitis, right elbow: Secondary | ICD-10-CM

## 2018-08-20 DIAGNOSIS — I10 Essential (primary) hypertension: Secondary | ICD-10-CM

## 2018-08-20 MED ORDER — TRAMADOL HCL 50 MG PO TABS: ORAL_TABLET | ORAL | 0 refills | Status: DC

## 2018-08-20 MED ORDER — PREDNISONE 5 MG (21) PO TBPK: ORAL_TABLET | ORAL | 0 refills | Status: DC

## 2018-08-20 MED ORDER — CYCLOBENZAPRINE HCL 10 MG PO TABS: 10.0000 mg | ORAL_TABLET | Freq: Every day | ORAL | 1 refills | Status: DC

## 2018-08-20 NOTE — Telephone Encounter (Signed)
Ok to rf? 

## 2018-08-20 NOTE — Telephone Encounter (Signed)
[facility] is asking for an alternative to Losartan 50mg  they are still experiencing a drug shortage on this medication.

## 2018-08-20 NOTE — Telephone Encounter (Signed)
Submitted to pharmacy 

## 2018-08-20 NOTE — Telephone Encounter (Signed)
y

## 2018-08-20 NOTE — Progress Notes (Signed)
 .  Numeric Pain Rating Scale and Functional Assessment Average Pain 8   In the last MONTH (on 0-10 scale) has pain interfered with the following?  1. General activity like being  able to carry out your everyday physical activities such as walking, climbing stairs, carrying groceries, or moving a chair?  Rating(8)   

## 2018-08-20 NOTE — Telephone Encounter (Signed)
Patient came into the clinic to get his note and then requested an RX refill on his tramadol and prednisone.  CB#[phone].  Thank you.

## 2018-08-20 NOTE — Procedures (Signed)
EMG & NCV Findings: Evaluation of the right median (across palm) sensory nerve showed no response (Palm) and prolonged distal peak latency (4.1 ms).  All remaining nerves (as indicated in the following tables) were within normal limits.    All examined muscles (as indicated in the following table) showed no evidence of electrical instability.    Impression: This is an  ABNORMAL electrodiagnostic study of the right upper limb.  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  This does not fit with his clinical symptoms and clinical correlation is paramount.  There may be some level of temperature artifact.    There is no significant electrodiagnostic evidence of other nerve entrapment (specifically no evidence of radial nerve entrapment), brachial plexopathy or cervical radiculopathy.  As you know, purely sensory or demyelinating radiculopathies and chemical radiculitis may not be detected with this particular electrodiagnostic study.  Recommendations: 1.  Follow-up with referring physician. 2.  Continue current management of symptoms.  ___________________________ Laurence Spates FAAPMR Board Certified, American Board of Physical Medicine and Rehabilitation    Nerve Conduction Studies Anti Sensory Summary Table   Stim Site NR Peak (ms) Norm Peak (ms) P-T Amp (V) Norm P-T Amp Site1 Site2 Delta-P (ms) Dist (cm) Vel (m/s) Norm Vel (m/s)  Right Median Acr Palm Anti Sensory (2nd Digit)  31.5C  Wrist    *4.1 <3.6 16.7 >10 Wrist Palm  0.0    Palm *NR  <2.0          Right Radial Anti Sensory (Base 1st Digit)  32.4C  Wrist    2.2 <3.1 26.2  Wrist Base 1st Digit 2.2 0.0    Right Ulnar Anti Sensory (5th Digit)  31.8C  Wrist    3.5 <3.7 17.8 >15.0 Wrist 5th Digit 3.5 14.0 40 >38   Motor Summary Table   Stim Site NR Onset (ms) Norm Onset (ms) O-P Amp (mV) Norm O-P Amp Site1 Site2 Delta-0 (ms) Dist (cm) Vel (m/s) Norm Vel (m/s)  Right Median Motor (Abd Poll Brev)  32.5C   Wrist    3.8 <4.2 7.7 >5 Elbow Wrist 4.6 24.5 53 >50  Elbow    8.4  5.8         Right Radial Motor (Ext Indicis)  33.7C  8cm    2.0 <2.5 3.7 >1.7 Up Arm 8cm 3.7 23.0 62 >60  Up Arm    5.7  4.4         Right Ulnar Motor (Abd Dig Min)  32.3C  Wrist    2.9 <4.2 12.2 >3 B Elbow Wrist 4.5 24.5 54 >53  B Elbow    7.4  11.5  A Elbow B Elbow 1.2 9.0 75 >53  A Elbow    8.6  12.0          EMG   Side Muscle Nerve Root Ins Act Fibs Psw Amp Dur Poly Recrt Int Fraser Din Comment  Right Abd Poll Brev Median C8-T1 Nml Nml Nml Nml Nml 0 Nml Nml   Right 1stDorInt Ulnar C8-T1 Nml Nml Nml Nml Nml 0 Nml Nml   Right ExtIndicis Radial (Post Int) C7-8 Nml Nml Nml Nml Nml 0 Nml Nml   Right ExtDigCom   Nml Nml Nml Nml Nml 0 Nml Nml   Right BrachioRad Radial C5-6 Nml Nml Nml Nml Nml 0 Nml Nml   Right Triceps Radial C6-7-8 Nml Nml Nml Nml Nml 0 Nml Nml   Right Deltoid Axillary C5-6 Nml Nml Nml Nml Nml  0 Nml Nml     Nerve Conduction Studies Anti Sensory Left/Right Comparison   Stim Site L Lat (ms) R Lat (ms) L-R Lat (ms) L Amp (V) R Amp (V) L-R Amp (%) Site1 Site2 L Vel (m/s) R Vel (m/s) L-R Vel (m/s)  Median Acr Palm Anti Sensory (2nd Digit)  31.5C  Wrist  *4.1   16.7  Wrist Palm     Palm             Radial Anti Sensory (Base 1st Digit)  32.4C  Wrist  2.2   26.2  Wrist Base 1st Digit     Ulnar Anti Sensory (5th Digit)  31.8C  Wrist  3.5   17.8  Wrist 5th Digit  40    Motor Left/Right Comparison   Stim Site L Lat (ms) R Lat (ms) L-R Lat (ms) L Amp (mV) R Amp (mV) L-R Amp (%) Site1 Site2 L Vel (m/s) R Vel (m/s) L-R Vel (m/s)  Median Motor (Abd Poll Brev)  32.5C  Wrist  3.8   7.7  Elbow Wrist  53   Elbow  8.4   5.8        Radial Motor (Ext Indicis)  33.7C  8cm  2.0   3.7  Up Arm 8cm  62   Up Arm  5.7   4.4        Ulnar Motor (Abd Dig Min)  32.3C  Wrist  2.9   12.2  B Elbow Wrist  54   B Elbow  7.4   11.5  A Elbow B Elbow  75   A Elbow  8.6   12.0           Waveforms:

## 2018-08-20 NOTE — Telephone Encounter (Signed)
Note written. IC patient advised per Dr Marlou Sa. Advised could pick up at front desk.

## 2018-08-20 NOTE — Telephone Encounter (Signed)
[place] for [employer] for 2 more weeks use otc for pain

## 2018-08-20 NOTE — Telephone Encounter (Signed)
Please advise. Thanks.  

## 2018-08-20 NOTE — Progress Notes (Signed)
Subjective:  Patient ID: John Collins, male    DOB: 12/23/1960  Age: 58 y.o. MRN: [id number]  CC: Elbow Injury   HPI John Collins is a 58 year old male with a history of hypertension, hyperlipidemia seen for lateral epicondylitis of the right elbow by Dr. Joya Gaskins on 07/31/2018 and referred to orthopedics whom he saw on 08/06/2018. He underwent a nerve conduction study today and is awaiting formal reports but since then has been out of work. He complains pain radiates to his right upper arm and right forearm with intermittent burning sensation. 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 He is currently stressed because he is unsure if his symptoms will worsen with his job or if he needs to search for another job and attributes his elevated blood pressure to the stress.  Past Medical History:  Diagnosis Date  . Cervical radiculopathy   . Hypertension     Past Surgical History:  Procedure Laterality Date  . COLONOSCOPY WITH PROPOFOL N/A 03/01/2014   Procedure: COLONOSCOPY WITH PROPOFOL;  Surgeon: Garlan Fair, MD;  Location: [facility];  Service: Endoscopy;  Laterality: N/A;    Family History  Problem Relation Age of Onset  . Thyroid disease Mother     Allergies  Allergen Reactions  . Lisinopril     Cough     Outpatient Medications Prior to Visit  Medication Sig Dispense Refill  . atorvastatin (LIPITOR) 20 MG tablet Take 1 tablet (20 mg total) by mouth daily. 90 tablet 1  . ibuprofen (ADVIL,MOTRIN) 600 MG tablet Take 1 tablet (600 mg total) by mouth every 8 (eight) hours as needed. 30 tablet 0  . losartan-hydrochlorothiazide (HYZAAR) 100-12.5 MG tablet Take 1 tablet by mouth daily. 90 tablet 3  . traMADol (ULTRAM) 50 MG tablet 1 po q hs prn pain 30 tablet 0  . predniSONE (STERAPRED UNI-PAK 21 TAB) 5 MG (21) TBPK tablet Take dosepak as directed (Patient not taking: Reported on 08/20/2018) 21 tablet 0   No facility-administered medications prior to  visit.      ROS Review of Systems  Constitutional: Negative for activity change and appetite change.  HENT: Negative for sinus pressure and sore throat.   Eyes: Negative for visual disturbance.  Respiratory: Negative for cough, chest tightness and shortness of breath.   Cardiovascular: Negative for chest pain and leg swelling.  Gastrointestinal: Negative for abdominal distention, abdominal pain, constipation and diarrhea.  Endocrine: Negative.   Genitourinary: Negative for dysuria.  Musculoskeletal:       See hpi  Skin: Negative for rash.  Allergic/Immunologic: Negative.   Neurological: Negative for weakness, light-headedness and numbness.  Psychiatric/Behavioral: Negative for dysphoric mood and suicidal ideas.    Objective:  BP (!) 150/90   Pulse 98   Temp 98.3 F (36.8 C) (Oral)   Ht 5\' 9"  (1.753 m)   Wt 204 lb (92.5 kg)   SpO2 99%   BMI 30.13 kg/m   BP/Weight 08/20/2018 07/31/2018 6/38/7564  Systolic BP 332 951 884  Diastolic BP 90 83 69  Wt. (Lbs) 204 206.8 -  BMI 30.13 30.54 -      Physical Exam Constitutional:      Appearance: He is well-developed.  Cardiovascular:     Rate and Rhythm: Normal rate.     Heart sounds: Normal heart sounds. No murmur.  Pulmonary:     Effort: Pulmonary effort is normal.     Breath sounds: Normal breath sounds. No wheezing or rales.  Chest:     Chest wall: No tenderness.  Abdominal:     General: Bowel sounds are normal. There is no distension.     Palpations: Abdomen is soft. There is no mass.     Tenderness: There is no abdominal tenderness.  Musculoskeletal: Normal range of motion.     Comments: Normal appearance. Tenderness on palpation of right lateral epicondyles. Normal range of motion.   Neurological:     Mental Status: He is alert and oriented to person, place, and time.     Comments: Handgrip-4/5 in right hand; 5/5 in left hand  Psychiatric:        Mood and Affect: Mood normal.      CMP Latest Ref Rng &  Units 12/17/2017 09/17/2017 06/06/2017  Glucose 65 - 99 mg/dL 110(H) 101(H) 130(H)  BUN 6 - 24 mg/dL 8 10 12   Creatinine 0.76 - 1.27 mg/dL 0.78 0.78 1.13  Sodium 134 - 144 mmol/L 142 143 141  Potassium 3.5 - 5.2 mmol/L 4.3 4.1 4.0  Chloride 96 - 106 mmol/L 103 103 102  CO2 20 - 29 mmol/L 21 24 24   Calcium 8.7 - 10.2 mg/dL 9.1 9.6 9.3  Total Protein 6.0 - 8.5 g/dL 7.1 7.7 7.1  Total Bilirubin 0.0 - 1.2 mg/dL 0.5 0.4 0.4  Alkaline Phos 39 - 117 IU/L 91 93 92  AST 0 - 40 IU/L 21 19 25   ALT 0 - 44 IU/L 20 17 32    Lipid Panel     Component Value Date/Time   CHOL 177 12/17/2017 0931   TRIG 97 12/17/2017 0931   HDL 55 12/17/2017 0931   CHOLHDL 3.2 12/17/2017 0931   CHOLHDL 4.2 11/11/2013 1623   VLDL 18 11/11/2013 1623   LDLCALC 103 (H) 12/17/2017 0931    CBC    Component Value Date/Time   WBC 5.8 11/11/2013 1623   RBC 4.41 11/11/2013 1623   HGB 12.7 (L) 11/11/2013 1623   HCT 37.6 (L) 11/11/2013 1623   PLT 256 11/11/2013 1623   MCV 85.3 11/11/2013 1623   MCH 28.8 11/11/2013 1623   MCHC 33.8 11/11/2013 1623   RDW 12.9 11/11/2013 1623   LYMPHSABS 2.1 11/11/2013 1623   MONOABS 0.5 11/11/2013 1623   EOSABS 0.1 11/11/2013 1623   BASOSABS 0.1 11/11/2013 1623    No results found for: HGBA1C  Assessment & Plan:   1. Lateral epicondylitis of right elbow Currently on NSAID and tramadol from orthopedics At the moment he is out of work-unsure if this is a workers comp case Advised to follow-up with his orthopedics, Dr. Marlou Sa - cyclobenzaprine (FLEXERIL) 10 MG tablet; Take 1 tablet (10 mg total) by mouth at bedtime.  Dispense: 30 tablet; Refill: 1  2. Essential hypertension, benign Blood pressure is elevated and he attributes this to being stressed from the uncertainties of his job especially in the setting of elbow problems   Meds ordered this encounter  Medications  . cyclobenzaprine (FLEXERIL) 10 MG tablet    Sig: Take 1 tablet (10 mg total) by mouth at bedtime.    Dispense:   30 tablet    Refill:  1    Follow-up: Return in about 3 months (around 11/20/2018) for for follow up on chronic medical conditions.       Charlott Rakes, MD, FAAFP. [facility] [place], [place]   08/20/2018, 6:33 PM

## 2018-08-20 NOTE — Progress Notes (Signed)
John Collins - 58 y.o. male MRN [id number]  Date of birth: 1961/05/26  Office Visit Note: Visit Date: 08/20/2018 PCP: Charlott Rakes, MD Referred by: Charlott Rakes, MD  Subjective: Chief Complaint  Patient presents with  . Right Shoulder - Pain  . Right Arm - Pain  . Right Elbow - Pain   HPI: John Collins is a 58 y.o. male who comes in today At the request of G. Alphonzo Severance for electrodiagnostic study of the right upper limb.  He reports pain initiated beginning February of this year and he feels like it refers into the right shoulder right upper arm lateral elbow and dorsal forearm.  He reports that typing and sitting upright seems to make the symptoms worse.  He does get some relief with stretching and activity modification.  Rates his pain is 8 out of 10.  He denies any left-sided symptoms.  He denies much in the way of any numbness she does get some tingling.  No prior history electrodiagnostic study or cervical spine issues.  He denies diabetes.  ROS Otherwise per HPI.  Assessment & Plan: Visit Diagnoses:  1. Paresthesia of skin     Plan:  Impression: This is an  ABNORMAL electrodiagnostic study of the right upper limb.  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  This does not fit with his clinical symptoms and clinical correlation is paramount.  There may be some level of temperature artifact.    There is no significant electrodiagnostic evidence of other nerve entrapment (specifically no evidence of radial nerve entrapment), brachial plexopathy or cervical radiculopathy.  As you know, purely sensory or demyelinating radiculopathies and chemical radiculitis may not be detected with this particular electrodiagnostic study.  Recommendations: 1.  Follow-up with referring physician. 2.  Continue current management of symptoms.  Meds & Orders: No orders of the defined types were placed in this encounter.   Orders Placed This Encounter  Procedures   . NCV with EMG (electromyography)    Follow-up: Return for  Anderson Malta, M.D..   Procedures: No procedures performed  EMG & NCV Findings: Evaluation of the right median (across palm) sensory nerve showed no response (Palm) and prolonged distal peak latency (4.1 ms).  All remaining nerves (as indicated in the following tables) were within normal limits.    All examined muscles (as indicated in the following table) showed no evidence of electrical instability.    Impression: This is an  ABNORMAL electrodiagnostic study of the right upper limb.  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  This does not fit with his clinical symptoms and clinical correlation is paramount.  There may be some level of temperature artifact.    There is no significant electrodiagnostic evidence of other nerve entrapment (specifically no evidence of radial nerve entrapment), brachial plexopathy or cervical radiculopathy.  As you know, purely sensory or demyelinating radiculopathies and chemical radiculitis may not be detected with this particular electrodiagnostic study.  Recommendations: 1.  Follow-up with referring physician. 2.  Continue current management of symptoms.  ___________________________ Laurence Spates FAAPMR Board Certified, American Board of Physical Medicine and Rehabilitation    Nerve Conduction Studies Anti Sensory Summary Table   Stim Site NR Peak (ms) Norm Peak (ms) P-T Amp (V) Norm P-T Amp Site1 Site2 Delta-P (ms) Dist (cm) Vel (m/s) Norm Vel (m/s)  Right Median Acr Palm Anti Sensory (2nd Digit)  31.5C  Wrist    *4.1 <3.6  16.7 >10 Wrist Palm  0.0    Palm *NR  <2.0          Right Radial Anti Sensory (Base 1st Digit)  32.4C  Wrist    2.2 <3.1 26.2  Wrist Base 1st Digit 2.2 0.0    Right Ulnar Anti Sensory (5th Digit)  31.8C  Wrist    3.5 <3.7 17.8 >15.0 Wrist 5th Digit 3.5 14.0 40 >38   Motor Summary Table   Stim Site NR Onset (ms) Norm Onset (ms) O-P  Amp (mV) Norm O-P Amp Site1 Site2 Delta-0 (ms) Dist (cm) Vel (m/s) Norm Vel (m/s)  Right Median Motor (Abd Poll Brev)  32.5C  Wrist    3.8 <4.2 7.7 >5 Elbow Wrist 4.6 24.5 53 >50  Elbow    8.4  5.8         Right Radial Motor (Ext Indicis)  33.7C  8cm    2.0 <2.5 3.7 >1.7 Up Arm 8cm 3.7 23.0 62 >60  Up Arm    5.7  4.4         Right Ulnar Motor (Abd Dig Min)  32.3C  Wrist    2.9 <4.2 12.2 >3 B Elbow Wrist 4.5 24.5 54 >53  B Elbow    7.4  11.5  A Elbow B Elbow 1.2 9.0 75 >53  A Elbow    8.6  12.0          EMG   Side Muscle Nerve Root Ins Act Fibs Psw Amp Dur Poly Recrt Int Fraser Din Comment  Right Abd Poll Brev Median C8-T1 Nml Nml Nml Nml Nml 0 Nml Nml   Right 1stDorInt Ulnar C8-T1 Nml Nml Nml Nml Nml 0 Nml Nml   Right ExtIndicis Radial (Post Int) C7-8 Nml Nml Nml Nml Nml 0 Nml Nml   Right ExtDigCom   Nml Nml Nml Nml Nml 0 Nml Nml   Right BrachioRad Radial C5-6 Nml Nml Nml Nml Nml 0 Nml Nml   Right Triceps Radial C6-7-8 Nml Nml Nml Nml Nml 0 Nml Nml   Right Deltoid Axillary C5-6 Nml Nml Nml Nml Nml 0 Nml Nml     Nerve Conduction Studies Anti Sensory Left/Right Comparison   Stim Site L Lat (ms) R Lat (ms) L-R Lat (ms) L Amp (V) R Amp (V) L-R Amp (%) Site1 Site2 L Vel (m/s) R Vel (m/s) L-R Vel (m/s)  Median Acr Palm Anti Sensory (2nd Digit)  31.5C  Wrist  *4.1   16.7  Wrist Palm     Palm             Radial Anti Sensory (Base 1st Digit)  32.4C  Wrist  2.2   26.2  Wrist Base 1st Digit     Ulnar Anti Sensory (5th Digit)  31.8C  Wrist  3.5   17.8  Wrist 5th Digit  40    Motor Left/Right Comparison   Stim Site L Lat (ms) R Lat (ms) L-R Lat (ms) L Amp (mV) R Amp (mV) L-R Amp (%) Site1 Site2 L Vel (m/s) R Vel (m/s) L-R Vel (m/s)  Median Motor (Abd Poll Brev)  32.5C  Wrist  3.8   7.7  Elbow Wrist  53   Elbow  8.4   5.8        Radial Motor (Ext Indicis)  33.7C  8cm  2.0   3.7  Up Arm 8cm  62   Up Arm  5.7   4.4        Ulnar  Motor (Abd Dig Min)  32.3C  Wrist  2.9   12.2  B Elbow  Wrist  54   B Elbow  7.4   11.5  A Elbow B Elbow  75   A Elbow  8.6   12.0           Waveforms:             Clinical History: No specialty comments available.   He reports that he has never smoked. He has never used smokeless tobacco. No results for input(s): HGBA1C, LABURIC in the last 8760 hours.  Objective:  VS:  HT:    WT:   BMI:     BP:   HR: bpm  TEMP:97.8 F (36.6 C)(Oral)  RESP:  Physical Exam Musculoskeletal:        Collins: No tenderness.     Comments: Inspection reveals no atrophy of the bilateral APB or FDI or hand intrinsics. There is no swelling, color changes, allodynia or dystrophic changes. There is 5 out of 5 strength in the bilateral wrist extension, finger abduction and long finger flexion. There is intact sensation to light touch in all dermatomal and peripheral nerve distributions. There is a negative Phalen's test bilaterally. There is a negative Hoffmann's test bilaterally.  Skin:    Collins: Skin is warm and dry.     Findings: No erythema or rash.  Neurological:     Mental Status: He is alert and oriented to person, place, and time.     Motor: No weakness or abnormal muscle tone.     Coordination: Coordination normal.     Ortho Exam Imaging: No results found.  Past Medical/Family/Surgical/Social History: Medications & Allergies reviewed per EMR, new medications updated. Patient Active Problem List   Diagnosis Date Noted  . Lateral epicondylitis of right elbow 07/31/2018  . Mixed hyperlipidemia 03/01/2014  . Essential hypertension, benign 11/11/2013   Past Medical History:  Diagnosis Date  . Cervical radiculopathy   . Hypertension    Family History  Problem Relation Age of Onset  . Thyroid disease Mother    Past Surgical History:  Procedure Laterality Date  . COLONOSCOPY WITH PROPOFOL N/A 03/01/2014   Procedure: COLONOSCOPY WITH PROPOFOL;  Surgeon: Garlan Fair, MD;  Location: [facility];  Service: Endoscopy;  Laterality: N/A;    Social History   Occupational History  . Not on file  Tobacco Use  . Smoking status: Never Smoker  . Smokeless tobacco: Never Used  Substance and Sexual Activity  . Alcohol use: Yes    Comment: rare to occ. monthly  . Drug use: No  . Sexual activity: Not on file

## 2018-08-21 MED ORDER — AMLODIPINE BESYLATE 10 MG PO TABS: 10.0000 mg | ORAL_TABLET | Freq: Every day | ORAL | 3 refills | Status: DC

## 2018-08-21 NOTE — Telephone Encounter (Signed)
Changed to amlodipine

## 2018-09-03 ENCOUNTER — Telehealth (INDEPENDENT_AMBULATORY_CARE_PROVIDER_SITE_OTHER): Payer: Self-pay | Admitting: Radiology

## 2018-09-03 NOTE — Telephone Encounter (Signed)
Answered no to all screening questions. 

## 2018-09-04 ENCOUNTER — Other Ambulatory Visit: Payer: Self-pay

## 2018-09-04 ENCOUNTER — Encounter (INDEPENDENT_AMBULATORY_CARE_PROVIDER_SITE_OTHER): Payer: Self-pay | Admitting: Orthopedic Surgery

## 2018-09-04 ENCOUNTER — Ambulatory Visit (INDEPENDENT_AMBULATORY_CARE_PROVIDER_SITE_OTHER): Payer: 59 | Admitting: Orthopedic Surgery

## 2018-09-04 DIAGNOSIS — M7711 Lateral epicondylitis, right elbow: Secondary | ICD-10-CM | POA: Diagnosis not present

## 2018-09-04 NOTE — Progress Notes (Signed)
   Office Visit Note   Patient: John Collins           Date of Birth: 03/16/1961           MRN: [id number] Visit Date: 09/04/2018 Requested by: Charlott Rakes, MD [place], [place] [address] PCP: Charlott Rakes, MD  Subjective: Chief Complaint  Patient presents with  . Follow-up    EMG/NCV review    HPI: Patient presents for follow-up of nerve study on his right arm.  He is having continuing symptoms but in general the medication has helped.  He has had 2 steroid Dosepaks.  Denies any neck pain.  Continues to localize the pain discretely to the lateral epicondyle of the right elbow.  Does have stiffness in the morning when he gets up.              ROS: All systems reviewed are negative as they relate to the chief complaint within the history of present illness.  Patient denies  fevers or chills.   Assessment & Plan: Visit Diagnoses:  1. Right tennis elbow     Plan: Impression is right tennis elbow.  Plan is he is tried a tennis elbow strap and that did help.  I think he would do better with physical therapy.  MRI scanning in about 2 months if he is not improving.  Continue with over-the-counter anti-inflammatories as well.  Follow-Up Instructions: Return if symptoms worsen or fail to improve.   Orders:  No orders of the defined types were placed in this encounter.  No orders of the defined types were placed in this encounter.     Procedures: No procedures performed   Clinical Data: No additional findings.  Objective: Vital Signs: There were no vitals taken for this visit.  Physical Exam:   Constitutional: Patient appears well-developed HEENT:  Head: Normocephalic Eyes:EOM are normal Neck: Normal range of motion Cardiovascular: Normal rate Pulmonary/chest: Effort normal Neurologic: Patient is alert Skin: Skin is warm Psychiatric: Patient has normal mood and affect    Ortho Exam: Ortho exam demonstrates good range of motion of the right  elbow with palpable intact nontender biceps and triceps.  Does have tenderness over that lateral condyle.  Motor sensory function of the hand is intact.  Radial pulses intact.  Does have some pain with resisted finger extension wrist extension on the right-hand side but no proximal lymphadenopathy is present.  Specialty Comments:  No specialty comments available.  Imaging: No results found.   PMFS History: Patient Active Problem List   Diagnosis Date Noted  . Lateral epicondylitis of right elbow 07/31/2018  . Mixed hyperlipidemia 03/01/2014  . Essential hypertension, benign 11/11/2013   Past Medical History:  Diagnosis Date  . Cervical radiculopathy   . Hypertension     Family History  Problem Relation Age of Onset  . Thyroid disease Mother     Past Surgical History:  Procedure Laterality Date  . COLONOSCOPY WITH PROPOFOL N/A 03/01/2014   Procedure: COLONOSCOPY WITH PROPOFOL;  Surgeon: Garlan Fair, MD;  Location: [facility];  Service: Endoscopy;  Laterality: N/A;   Social History   Occupational History  . Not on file  Tobacco Use  . Smoking status: Never Smoker  . Smokeless tobacco: Never Used  Substance and Sexual Activity  . Alcohol use: Yes    Comment: rare to occ. monthly  . Drug use: No  . Sexual activity: Not on file

## 2018-09-04 NOTE — Addendum Note (Signed)
Addended by: Laurann Montana on: 09/04/2018 11:13 AM   Modules accepted: Orders

## 2018-09-09 ENCOUNTER — Telehealth (INDEPENDENT_AMBULATORY_CARE_PROVIDER_SITE_OTHER): Payer: Self-pay | Admitting: Orthopedic Surgery

## 2018-09-09 NOTE — Telephone Encounter (Signed)
Patient called stating that the PT Dr. Marlou Sa referred him to has not called him.  I advised him that most of the outpatient facilities for [facility] are temporarily closed.  He wanted to know if Dr. Marlou Sa would refer him to another outpatient PT.  CB#[phone].  Thank you.

## 2018-09-09 NOTE — Telephone Encounter (Signed)
See message below °

## 2018-09-10 MED ORDER — IBUPROFEN 600 MG PO TABS: 600.0000 mg | ORAL_TABLET | Freq: Three times a day (TID) | ORAL | 0 refills | Status: DC | PRN

## 2018-09-10 NOTE — Telephone Encounter (Signed)
thx

## 2018-09-10 NOTE — Telephone Encounter (Signed)
I faxed order to [facility] since [facility] facilities closed but he is asking for something for pain, he said he is out of the ibuprofen.  He also stated that he was given a work note keeping him out of work until Monday.  He said that He is having a hard time sleeping, can his work note be extended since he has not started therapy yet?

## 2018-09-10 NOTE — Telephone Encounter (Signed)
Ok to ext note for another week

## 2018-09-10 NOTE — Telephone Encounter (Signed)
Ok for [facility] or other pt pls call thx

## 2018-09-10 NOTE — Telephone Encounter (Signed)
He would like to try [facility]. I will fax order.

## 2018-09-10 NOTE — Telephone Encounter (Signed)
Patient requested refill on ibuprofen. I sent this in for patient.

## 2018-09-18 ENCOUNTER — Other Ambulatory Visit: Payer: Self-pay

## 2018-09-18 ENCOUNTER — Encounter: Payer: Self-pay | Admitting: Physical Therapy

## 2018-09-18 ENCOUNTER — Ambulatory Visit: Payer: 59 | Attending: Orthopedic Surgery | Admitting: Physical Therapy

## 2018-09-18 DIAGNOSIS — M6281 Muscle weakness (generalized): Secondary | ICD-10-CM

## 2018-09-18 DIAGNOSIS — M25521 Pain in right elbow: Secondary | ICD-10-CM | POA: Diagnosis not present

## 2018-09-18 NOTE — Patient Instructions (Signed)
Access Code: [email or web address]  URL: [email or web address]  Date: 09/18/2018  Prepared by: Elsie Ra   Exercises  Standing Wrist Extensor Stretch with Arm Bent - 3 reps - 1 sets - 30 hold - 2x daily - 6x weekly  Seated Eccentric Wrist Extension - 10 reps - 1-3 sets - 2x daily - 6x weekly  Seated Wrist Supination Pronation with Can - 10 reps - 1-3 sets - 2x daily - 6x weekly  Seated Gripping Towel - 20 reps - 2 sets - 2x daily - 6x weekly  Wall Push Up - 10 reps - 3 sets - 2x daily - 6x weekly  Standing Elbow Extension with Self-Anchored Resistance - 10 reps - 3 sets - 2x daily - 6x weekly  Standing Elbow Flexion with Resistance - 10 reps - 3 sets - 2x daily - 6x weekly  Patient Education  TENS Unit TENS UNIT: This is helpful for muscle pain and spasm.   Search and Purchase a TENS 7000 2nd edition at www.tenspros.com. It should be less than $30.     TENS unit instructions: Do not shower or bathe with the unit on Turn the unit off before removing electrodes or batteries If the electrodes lose stickiness add a drop of water to the electrodes after they are disconnected from the unit and place on plastic sheet. If you continued to have difficulty, call the TENS unit company to purchase more electrodes. Do not apply lotion on the skin area prior to use. Make sure the skin is clean and dry as this will help prolong the life of the electrodes. After use, always check skin for unusual red areas, rash or other skin difficulties. If there are any skin problems, does not apply electrodes to the same area. Never remove the electrodes from the unit by pulling the wires. Do not use the TENS unit or electrodes other than as directed. Do not change electrode placement without consultating your therapist or physician. Keep 2 fingers with between each electrode. Wear time ratio is 2:1, on to off times.    For example on for 30 minutes off for 15 minutes and then on for 30 minutes off for 15  minutes

## 2018-09-22 ENCOUNTER — Encounter: Payer: Self-pay | Admitting: Physical Therapy

## 2018-09-22 ENCOUNTER — Other Ambulatory Visit: Payer: Self-pay

## 2018-09-22 ENCOUNTER — Ambulatory Visit: Payer: 59 | Admitting: Physical Therapy

## 2018-09-22 DIAGNOSIS — M25521 Pain in right elbow: Secondary | ICD-10-CM

## 2018-09-22 DIAGNOSIS — M6281 Muscle weakness (generalized): Secondary | ICD-10-CM

## 2018-09-22 NOTE — Therapy (Signed)
[place], [place], [address] Phone: [phone]   Fax:  [phone]  Physical Therapy Evaluation  Patient Details  Name: John Collins MRN: [id number] Date of Birth: 1961/04/02 Referring Provider (PT): Marlou Sa Tonna Corner, MD   Encounter Date: 09/18/2018  PT End of Session - 09/22/18 1000    Visit Number  1    Number of Visits  12    Date for PT Re-Evaluation  10/20/18    Authorization Type  [employer]    PT Start Time  1230    PT Stop Time  1320    PT Time Calculation (min)  50 min    Activity Tolerance  Patient tolerated treatment well    Behavior During Therapy  [facility] for tasks assessed/performed       Past Medical History:  Diagnosis Date  . Cervical radiculopathy   . Hypertension     Past Surgical History:  Procedure Laterality Date  . COLONOSCOPY WITH PROPOFOL N/A 03/01/2014   Procedure: COLONOSCOPY WITH PROPOFOL;  Surgeon: Garlan Fair, MD;  Location: [facility];  Service: Endoscopy;  Laterality: N/A;    There were no vitals filed for this visit.   Subjective Assessment - 09/22/18 0956    Subjective  Pt relays he has had Rt elbow pain that started around Febuary. He saw MD and was diagnosed with Tennis elbow. He Has had 2 steroid dosepaks, tried tennis elbow strap all with minor relief, he will have MRI in 2 months if no improvments. 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 He is currently out of work due to the elbow pain and swelling.     Pertinent History  PMH:HTN, Hyperlipidemia, cervical radic    Limitations  Other (comment)   work   Diagnostic tests  XR of elbow negative, Per pt he had NCV test that showed no nerve damage.    Currently in Pain?  Yes    Pain Score  6     Pain Orientation  Right    Pain Descriptors / Indicators  Aching;Tightness    Pain Type  Acute pain    Pain Radiating Towards  elbow down to fingers, denies N/T    Pain Onset  More than a  month ago    Pain Frequency  Constant    Aggravating Factors   extending elbow, lifting, carrying, gripping, work duties    Pain Relieving Factors  NSAIDS, pain meds    Multiple Pain Sites  No         [facility] PT Assessment - 09/22/18 0957      Assessment   Medical Diagnosis  Rt elbow pain/tennis elbow    Referring Provider (PT)  Meredith Pel, MD    Onset Date/Surgical Date  --   elbow pain since Febuary 2019   Hand Dominance  Right    Next MD Visit  not scheduled yet    Prior Therapy  none      Precautions   Precautions  None      Restrictions   Other Position/Activity Restrictions  pt is currently out of work until next week with no heavy lifting restrictions      Prior Function   Level of Independence  Independent    Vocation  Full time employment    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    Leisure  work out, has not been able to  Cognition   Overall Cognitive Status  Within Functional Limits for tasks assessed      Observation/Other Assessments   Focus on Therapeutic Outcomes (FOTO)   55% limited      AROM   Right Elbow Flexion  115    Right Elbow Extension  3      Strength   Overall Strength Comments  grip 87 lbs on Lt, 40 lbs on Rt, wrist ext limited to 4/5 and painful, WNL for supination, pronation, wrist flexion, shoulder WNL    Right Elbow Flexion  4+/5    Right Elbow Extension  3+/5      Palpation   Palpation comment  TTP at lateral epicondyle      Special Tests   Other special tests  pain with resisted triceps ext and wrist ext and pain when these are stretched consistent with tendonitis      Ambulation/Gait   Gait Comments  WNL                Objective measurements completed on examination: See above findings.      [facility] Adult PT Treatment/Exercise - 09/22/18 0957      Modalities   Modalities  Cryotherapy;Electrical Stimulation      Cryotherapy   Number Minutes Cryotherapy  15 Minutes     Cryotherapy Location  Other (comment)   elbow   Type of Cryotherapy  Ice pack      Electrical Stimulation   Electrical Stimulation Location  Rt elbow    Electrical Stimulation Action  IFC    Electrical Stimulation Parameters  tolerance    Electrical Stimulation Goals  Pain             PT Education - 09/22/18 1000    Education Details  HEP, POC, TENS    Person(s) Educated  Patient    Methods  Explanation;Demonstration;Verbal cues;Handout    Comprehension  Verbalized understanding          PT Long Term Goals - 09/22/18 1007      PT LONG TERM GOAL #1   Title  Pt will increase Rt elbow strength and ROM to WNL. 4 weeks 10/20/18    Status  New      PT LONG TERM GOAL #2   Title  Pt will improve gripping strength on Rt to at least equal to left to improve function. 4 weeks 10/20/18      PT LONG TERM GOAL #3   Title  Pt will report overall less than 2/10 pain in Rt elbow with usual activity and allow return to work. 4 weeks 10/20/18      PT LONG TERM GOAL #4   Title  Pt will be I and compliant with HEP. 4 weeks 10/20/18    Status  New      PT LONG TERM GOAL #5   Title  Pt will improve FOTO to less than 37% limited.     Status  New             Plan - 09/22/18 1001    Clinical Impression Statement  Pt presents with sings and symptoms of Rt lateral epicondylitis and possible triceps insertional tendonitis. 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 He will benefit from skilled PT to address his defecits. He is suppossed to try to start back work next week however PT feels  this may complicate his rehab potential and provoke elbow pain. Due to the high irritability and reproduction of his symptoms today he was advised to call MD about delaying his return to work duration.     Personal Factors and Comorbidities   Comorbidity 1;Comorbidity 2;Profession    Comorbidities  HBP, HCL    Examination-Activity Limitations  Lift;Bend;Carry;Reach Overhead    Examination-Participation Restrictions  Cleaning;Community Activity;Yard Work;Driving;Other   work   Stability/Clinical Decision Making  Stable/Uncomplicated    Designer, jewellery  Low    Rehab Potential  Fair    PT Frequency  3x / week    PT Duration  4 weeks    PT Treatment/Interventions  ADLs/Self Care Home Management;Cryotherapy;Electrical Stimulation;Iontophoresis 4mg /ml Dexamethasone;Moist Heat;Ultrasound;Therapeutic activities;Therapeutic exercise;Neuromuscular re-education;Patient/family education;Manual techniques;Dry needling;Passive range of motion;Taping;Vasopneumatic Device;Joint Manipulations    PT Next Visit Plan  modalties, manual, gripping and eccentric strengthening    PT Home Exercise Plan  [email or web address]    Consulted and Agree with Plan of Care  Patient       Patient will benefit from skilled therapeutic intervention in order to improve the following deficits and impairments:  Decreased activity tolerance, Decreased endurance, Decreased range of motion, Decreased strength, Impaired flexibility, Pain  Visit Diagnosis: Pain in right elbow  Muscle weakness (generalized)     Problem List Patient Active Problem List   Diagnosis Date Noted  . Lateral epicondylitis of right elbow 07/31/2018  . Mixed hyperlipidemia 03/01/2014  . Essential hypertension, benign 11/11/2013    [name] 09/22/2018, 10:12 AM  [facility] [address] [place], [place], [address] Phone: [phone]   Fax:  [phone]  Name: John Collins MRN: [id number] Date of Birth: 01-31-1961

## 2018-09-22 NOTE — Therapy (Signed)
[place], [place], [address] Phone: [phone]   Fax:  [phone]  Physical Therapy Treatment  Patient Details  Name: John Collins MRN: [id number] Date of Birth: 1960-12-21 Referring Provider (PT): Marlou Sa Tonna Corner, MD   Encounter Date: 09/22/2018  PT End of Session - 09/22/18 1459    Visit Number  2    Number of Visits  12    Date for PT Re-Evaluation  10/20/18    Authorization Type  U[employer]    PT Start Time  8325   pt arrived 12 min late   PT Stop Time  1530    PT Time Calculation (min)  33 min    Activity Tolerance  Patient tolerated treatment well    Behavior During Therapy  [facility] for tasks assessed/performed       Past Medical History:  Diagnosis Date  . Cervical radiculopathy   . Hypertension     Past Surgical History:  Procedure Laterality Date  . COLONOSCOPY WITH PROPOFOL N/A 03/01/2014   Procedure: COLONOSCOPY WITH PROPOFOL;  Surgeon: Garlan Fair, MD;  Location: [facility];  Service: Endoscopy;  Laterality: N/A;    There were no vitals filed for this visit.  Subjective Assessment - 09/22/18 1503    Subjective  "I am doing okay, I need to do more of the exercise and would like to go over them"     Currently in Pain?  Yes    Pain Score  6     Pain Orientation  Right    Pain Descriptors / Indicators  Sore;Tightness    Aggravating Factors   direct pressure,     Pain Relieving Factors  meds                       [facility] Adult PT Treatment/Exercise - 09/22/18 1500      Exercises   Exercises  Elbow;Shoulder      Elbow Exercises   Elbow Flexion  Right;15 reps;Theraband    Theraband Level (Elbow Flexion)  Level 3 (Green)    Elbow Extension  15 reps;Standing;Right    Other elbow exercises  r forearm supination/ pronation 2 x 10 with 1 #    Other elbow exercises  wrist extensor stretch 2 x 30 sec      Shoulder Exercises: Standing   Other Standing Exercises   wall push-up 1 x 10      Manual Therapy   Manual Therapy  Joint mobilization;Soft tissue mobilization    Manual therapy comments  skilled palpation and monitoring of pt throughout TPDN    Joint Mobilization  lateral gapping mob grade 3 with pt actively gripping    Soft tissue mobilization  IASTM along the common extensors and supinator       Trigger Point Dry Needling - 09/22/18 0001    Consent Given?  Yes    Education Handout Provided  Yes    Muscles Treated Wrist/Hand  Extensor carpi radialis longus/brevis    Extensor carpi radialis longus/brevis Response  Twitch response elicited;Palpable increased muscle length   r only          PT Education - 09/22/18 1533    Education Details  reviewed previous HEP. muscle anatomy and referral patterns, What TPDN is and benefits as well as what to expect.     Person(s) Educated  Patient    Methods  Explanation;Verbal cues    Comprehension  Verbalized understanding;Verbal cues required  PT Long Term Goals - 09/22/18 1007      PT LONG TERM GOAL #1   Title  Pt will increase Rt elbow strength and ROM to WNL. 4 weeks 10/20/18    Status  New      PT LONG TERM GOAL #2   Title  Pt will improve gripping strength on Rt to at least equal to left to improve function. 4 weeks 10/20/18      PT LONG TERM GOAL #3   Title  Pt will report overall less than 2/10 pain in Rt elbow with usual activity and allow return to work. 4 weeks 10/20/18      PT LONG TERM GOAL #4   Title  Pt will be I and compliant with HEP. 4 weeks 10/20/18    Status  New      PT LONG TERM GOAL #5   Title  Pt will improve FOTO to less than 37% limited.     Status  New            Plan - 09/22/18 1529    Clinical Impression Statement  Limited session due to pt arriving late and was onthe phone for 10 min into session. educated and consent was given for TPDN along the supinator and common extensors followed with IASTM. reviewed HEP which pt requested due to being  confused, and helped answer any questions regarding his HEP. end of sessoin he noted decreased pain/ soreness.     PT Treatment/Interventions  ADLs/Self Care Home Management;Cryotherapy;Electrical Stimulation;Iontophoresis 4mg /ml Dexamethasone;Moist Heat;Ultrasound;Therapeutic activities;Therapeutic exercise;Neuromuscular re-education;Patient/family education;Manual techniques;Dry needling;Passive range of motion;Taping;Vasopneumatic Device;Joint Manipulations    PT Next Visit Plan  assess response to DN, IASTM, gripping and eccentric strengthening.    Consulted and Agree with Plan of Care  Patient       Patient will benefit from skilled therapeutic intervention in order to improve the following deficits and impairments:  Decreased activity tolerance, Decreased endurance, Decreased range of motion, Decreased strength, Impaired flexibility, Pain  Visit Diagnosis: Pain in right elbow  Muscle weakness (generalized)     Problem List Patient Active Problem List   Diagnosis Date Noted  . Lateral epicondylitis of right elbow 07/31/2018  . Mixed hyperlipidemia 03/01/2014  . Essential hypertension, benign 11/11/2013   Starr Lake PT, DPT, LAT, ATC  09/22/18  3:35 PM      [facility] [address] [place], [place], [address] Phone: [phone]   Fax:  [phone]  Name: John Collins MRN: [id number] Date of Birth: [birth year]

## 2018-09-22 NOTE — Addendum Note (Signed)
Addended by: Debbe Odea on: 09/22/2018 03:10 PM   Modules accepted: Orders

## 2018-09-23 ENCOUNTER — Telehealth (INDEPENDENT_AMBULATORY_CARE_PROVIDER_SITE_OTHER): Payer: Self-pay | Admitting: Orthopedic Surgery

## 2018-09-24 ENCOUNTER — Ambulatory Visit: Payer: 59 | Admitting: Physical Therapy

## 2018-09-24 ENCOUNTER — Other Ambulatory Visit: Payer: Self-pay

## 2018-09-24 DIAGNOSIS — M25521 Pain in right elbow: Secondary | ICD-10-CM

## 2018-09-24 DIAGNOSIS — M6281 Muscle weakness (generalized): Secondary | ICD-10-CM

## 2018-09-24 NOTE — Therapy (Signed)
[place], [place], [address] Phone: [phone]   Fax:  [phone]  Physical Therapy Treatment  Patient Details  Name: John Collins MRN: [id number] Date of Birth: Jan 12, 1961 Referring Provider (PT): Marlou Sa Tonna Corner, MD   Encounter Date: 09/24/2018  PT End of Session - 09/24/18 1320    Visit Number  3    Number of Visits  12    Date for PT Re-Evaluation  10/20/18    Authorization Type  UHC    PT Start Time  1239    PT Stop Time  1325    PT Time Calculation (min)  46 min    Activity Tolerance  Patient tolerated treatment well    Behavior During Therapy  [facility] for tasks assessed/performed       Past Medical History:  Diagnosis Date  . Cervical radiculopathy   . Hypertension     Past Surgical History:  Procedure Laterality Date  . COLONOSCOPY WITH PROPOFOL N/A 03/01/2014   Procedure: COLONOSCOPY WITH PROPOFOL;  Surgeon: Garlan Fair, MD;  Location: [facility];  Service: Endoscopy;  Laterality: N/A;    There were no vitals filed for this visit.  Subjective Assessment - 09/24/18 1247    Subjective  Elbow feels sore but maybe a little better    Currently in Pain?  Yes    Pain Score  5     Pain Location  Elbow    Pain Orientation  Right    Pain Descriptors / Indicators  Sore    Pain Type  Acute pain    Pain Radiating Towards  denies    Pain Onset  More than a month ago    Pain Frequency  Constant                       [facility] Adult PT Treatment/Exercise - 09/24/18 0001      Elbow Exercises   Elbow Flexion  Right;20 reps    Bar Weights/Barbell (Elbow Flexion)  5 lbs    Elbow Extension  Both;20 reps    Theraband Level (Elbow Extension)  Level 3 (Green)    Other elbow exercises  UBE L2 fwd/bkwd 2 min each, supination/pronation 2 lbs X 20 and then eccentric wrist extension 8 sec lower 2 lbs X15    Other elbow exercises  wrist extensor stretch and triceps stretch for Rt  3X30 sec ea, gripping green Dgrip 2X20      Shoulder Exercises: Standing   Other Standing Exercises  counter top push up X 15      Modalities   Modalities  Cryotherapy;Electrical Stimulation;Iontophoresis      Cryotherapy   Number Minutes Cryotherapy  10 Minutes   with E stim   Cryotherapy Location  Other (comment)   Rt elbow   Type of Cryotherapy  Ice pack      Electrical Stimulation   Electrical Stimulation Location  Rt elbow    Electrical Stimulation Action  IFC  X10 min    Electrical Stimulation Parameters  tolerance    Electrical Stimulation Goals  Pain      Iontophoresis   Type of Iontophoresis  Dexamethasone    Location  Rt elbow    Dose  80 mMP wear home patch, 1 CC dex    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 4 weeks 10/20/18    Status  New      PT LONG TERM GOAL #2   Title  Pt will improve gripping strength on Rt to at least equal to left to improve function. 4 weeks 10/20/18      PT LONG TERM GOAL #3   Title  Pt will report overall less than 2/10 pain in Rt elbow with usual activity and allow return to work. 4 weeks 10/20/18      PT LONG TERM GOAL #4   Title  Pt will be I and compliant with HEP. 4 weeks 10/20/18    Status  New      PT LONG TERM GOAL #5   Title  Pt will improve FOTO to less than 37% limited.     Status  New            Plan - 09/24/18 1321    Clinical Impression Statement  Session focused on elbow and wrist stretching and  strengthening with good tolerance along with manual therapy and modalaties. He was shown how to perform self IASTM at home to elbow extensors and tricep. Ionto trialed today in efforts to decrease pain and inflammation.     Rehab Potential  Fair    PT Frequency  3x / week    PT Duration  4 weeks    PT Treatment/Interventions  ADLs/Self Care Home Management;Cryotherapy;Electrical Stimulation;Iontophoresis 4mg /ml Dexamethasone;Moist Heat;Ultrasound;Therapeutic activities;Therapeutic exercise;Neuromuscular re-education;Patient/family education;Manual techniques;Dry needling;Passive range of motion;Taping;Vasopneumatic Device;Joint Manipulations    PT Next Visit Plan  assess response to IONTO, IASTM, gripping and eccentric strengthening.    PT Home Exercise Plan  [id number]    Consulted and Agree with Plan of Care  Patient       Patient will benefit from skilled therapeutic intervention in order to improve the following deficits and impairments:  Decreased activity tolerance, Decreased endurance, Decreased range of motion, Decreased strength, Impaired flexibility, Pain  Visit Diagnosis: Pain in right elbow  Muscle weakness (generalized)     Problem List Patient Active Problem List   Diagnosis Date Noted  . Lateral epicondylitis of right elbow 07/31/2018  . Mixed hyperlipidemia 03/01/2014  . Essential hypertension, benign 11/11/2013    John Collins 09/24/2018, 1:46 PM  [facility] [address] [place], [place], [address] Phone: [phone]   Fax:  [phone]  Name: John Collins MRN: [id number] Date of Birth: 06-05-1960

## 2018-09-26 ENCOUNTER — Ambulatory Visit: Payer: 59 | Admitting: Physical Therapy

## 2018-09-26 ENCOUNTER — Other Ambulatory Visit: Payer: Self-pay

## 2018-09-26 DIAGNOSIS — M25521 Pain in right elbow: Secondary | ICD-10-CM | POA: Diagnosis not present

## 2018-09-26 DIAGNOSIS — M6281 Muscle weakness (generalized): Secondary | ICD-10-CM

## 2018-09-26 NOTE — Therapy (Signed)
[place], [place], [address] Phone: [phone]   Fax:  [phone]  Physical Therapy Treatment  Patient Details  Name: John Collins MRN: [id number] Date of Birth: [birth year] Referring Provider (PT): Marlou Sa Tonna Corner, MD   Encounter Date: 09/26/2018  PT End of Session - 09/26/18 1251    Visit Number  4    Number of Visits  12    Date for PT Re-Evaluation  10/20/18    Authorization Type  UHC    PT Start Time  1238    PT Stop Time  1325    PT Time Calculation (min)  47 min    Activity Tolerance  Patient tolerated treatment well    Behavior During Therapy  [facility] for tasks assessed/performed       Past Medical History:  Diagnosis Date  . Cervical radiculopathy   . Hypertension     Past Surgical History:  Procedure Laterality Date  . COLONOSCOPY WITH PROPOFOL N/A 03/01/2014   Procedure: COLONOSCOPY WITH PROPOFOL;  Surgeon: Garlan Fair, MD;  Location: [facility];  Service: Endoscopy;  Laterality: N/A;    There were no vitals filed for this visit.  Subjective Assessment - 09/26/18 1249    Subjective  Pt relays my elbow was bothering me yesterday but today it feels better, he thinks the Ionto patch helped some    Currently in Pain?  Yes    Pain Score  4     Pain Location  Elbow    Pain Orientation  Right    Pain Descriptors / Indicators  Sore;Aching    Pain Type  Acute pain    Pain Radiating Towards  denies                       [facility] Adult PT Treatment/Exercise - 09/26/18 1255      Elbow Exercises   Elbow Flexion  Right;Other reps (comment)   25   Bar Weights/Barbell (Elbow Flexion)  Other (comment)   6   Elbow Extension  Both;20 reps    Theraband Level (Elbow Extension)  Level 3 (Green)    Other elbow exercises  UBE L2 fwd/bkwd 2 min each, supination/pronation and radial deviation 3 lbs X 20 and then eccentric wrist extension 8 sec lower 3 lbs X20, shoulder ext  with green Tband  x20 bilat, farmers carry 15 lbs one lap (115 ft), push ups at counter top 2X10    Other elbow exercises  wrist extensor stretch and triceps stretch for Rt 3X30 sec ea, gripping green Dgrip 2X25      Shoulder Exercises: Standing   Other Standing Exercises  --      Modalities   Modalities  Cryotherapy;Electrical Stimulation;Iontophoresis      Cryotherapy   Number Minutes Cryotherapy  10 Minutes    Cryotherapy Location  Other (comment)   Rt elbow   Type of Cryotherapy  Ice pack      Electrical Stimulation   Electrical Stimulation Location  Rt elbow    Electrical Stimulation Action  IFC 10 min    Electrical Stimulation Parameters  tolerance    Electrical Stimulation Goals  Pain      Iontophoresis   Type of Iontophoresis  Dexamethasone    Location  Rt elbow    Dose  80 mMP wear home patch, 1 CC dex    Time  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 4 weeks 10/20/18    Status  New      PT LONG TERM GOAL #2   Title  Pt will improve gripping strength on Rt to at least equal to left to improve function. 4 weeks 10/20/18      PT LONG TERM GOAL #3   Title  Pt will report overall less than 2/10 pain in Rt elbow with usual activity and allow return to work. 4 weeks 10/20/18      PT LONG TERM GOAL #4   Title  Pt will be I and compliant with HEP. 4 weeks 10/20/18    Status  New      PT LONG TERM GOAL #5   Title  Pt will improve FOTO to less than 37% limited.     Status  New            Plan - 09/26/18 1317    Clinical Impression Statement  Pt able to progress his strengthening program with good tolerance today and without complaints. He wants to know how long he should stay out of work and PT advised him  to call his MD about this issue. Modalities continued to reduce pain and inflammation in his elbow as he feels they are helping with this. Continue POC    Rehab Potential  Fair    PT Frequency  3x / week    PT Duration  4 weeks    PT Treatment/Interventions  ADLs/Self Care Home Management;Cryotherapy;Electrical Stimulation;Iontophoresis 4mg /ml Dexamethasone;Moist Heat;Ultrasound;Therapeutic activities;Therapeutic exercise;Neuromuscular re-education;Patient/family education;Manual techniques;Dry needling;Passive range of motion;Taping;Vasopneumatic Device;Joint Manipulations    PT Next Visit Plan  assess response to IONTO, IASTM, gripping and eccentric strengthening. DN PRN    PT Home Exercise Plan  [id number]    Consulted and Agree with Plan of Care  Patient       Patient will benefit from skilled therapeutic intervention in order to improve the following deficits and impairments:  Decreased activity tolerance, Decreased endurance, Decreased range of motion, Decreased strength, Impaired flexibility, Pain  Visit Diagnosis: Pain in right elbow  Muscle weakness (generalized)     Problem List Patient Active Problem List   Diagnosis Date Noted  . Lateral epicondylitis of right elbow 07/31/2018  . Mixed hyperlipidemia 03/01/2014  . Essential hypertension, benign 11/11/2013    John Collins 09/26/2018, 1:20 PM  [facility] [address] [place], [place], [address] Phone: [phone]   Fax:  [phone]  Name: Sir Mallis MRN: [id number] Date of Birth: 21-Mar-1961

## 2018-09-29 ENCOUNTER — Ambulatory Visit: Payer: 59 | Admitting: Physical Therapy

## 2018-09-29 ENCOUNTER — Other Ambulatory Visit: Payer: Self-pay

## 2018-09-29 ENCOUNTER — Encounter: Payer: Self-pay | Admitting: Physical Therapy

## 2018-09-29 DIAGNOSIS — M25521 Pain in right elbow: Secondary | ICD-10-CM | POA: Diagnosis not present

## 2018-09-29 DIAGNOSIS — M6281 Muscle weakness (generalized): Secondary | ICD-10-CM

## 2018-09-29 NOTE — Therapy (Signed)
[place], [place], [address] Phone: [phone]   Fax:  [phone]  Physical Therapy Treatment  Patient Details  Name: John Collins MRN: [id number] Date of Birth: 04/15/1961 Referring Provider (PT): Marlou Sa Tonna Corner, MD   Encounter Date: 09/29/2018  PT End of Session - 09/29/18 1019    Visit Number  5    Number of Visits  12    Date for PT Re-Evaluation  10/20/18    Authorization Type  UHC    PT Start Time  1017    PT Stop Time  1056    PT Time Calculation (min)  39 min    Activity Tolerance  Patient tolerated treatment well    Behavior During Therapy  [facility] for tasks assessed/performed       Past Medical History:  Diagnosis Date  . Cervical radiculopathy   . Hypertension     Past Surgical History:  Procedure Laterality Date  . COLONOSCOPY WITH PROPOFOL N/A 03/01/2014   Procedure: COLONOSCOPY WITH PROPOFOL;  Surgeon: Garlan Fair, MD;  Location: [facility];  Service: Endoscopy;  Laterality: N/A;    There were no vitals filed for this visit.  Subjective Assessment - 09/29/18 1019    Subjective  "I was sore after the DN the first day after but then after that it got better. I still have pain at night when I am reaching out. due to having increased soreness I would like hold off on the DN    Currently in Pain?  Yes    Pain Score  7     Pain Location  Elbow    Pain Orientation  Right    Pain Descriptors / Indicators  Aching    Pain Type  Acute pain    Pain Onset  More than a month ago    Pain Frequency  Constant    Aggravating Factors   direct pressure, extensing the elbow.                       [facility] Adult PT Treatment/Exercise - 09/29/18 0001      Self-Care   Self-Care  Other Self-Care Comments    Other Self-Care Comments   how to perform MTPR along the common extensors      Exercises   Exercises  Wrist      Elbow Exercises   Elbow Flexion  Right;15  reps;Theraband;Supine   to promote eccentric loading only   Elbow Extension  Both;20 reps    Theraband Level (Elbow Extension)  Level 3 (Green)    Other elbow exercises  supination/ pronation with emphasis on eccentrics 1 x 50 (in supine)    Other elbow exercises  wrist extensor stretch 2 x 20 seconds, Tricep stretch 2 x 30 seconds      Shoulder Exercises: ROM/Strengthening   UBE (Upper Arm Bike)  L1 x 4 min    changing direction at 2 x min     Wrist Exercises   Wrist Extension  Strengthening;15 reps;Supine;Theraband   green with emphasis on eccentrics   Other wrist exercises  Yellow theraband resisted eccentric loading 2 x 10       Iontophoresis   Type of Iontophoresis  Dexamethasone    Location  Rt elbow    Dose  4mg /ml    Time  6 hour patch      Manual Therapy   Manual Therapy  Other (comment)    Manual therapy comments  MTPR along the supinator / common extensors and distal tricep,    Joint Mobilization  lateral gapping mob grade 3 with pt actively gripping    Soft tissue mobilization  IASTM and CFM to wrist extensors and triceps    Other Manual Therapy  trial of  pre-wrap compression band along proximal common extensors to relieve tenson off tendon   pt noted decreased pain            PT Education - 09/29/18 1058    Education Details  how to perform MTPR along the common extensors, reviewed use of kitchen utensils for IASTM           PT Long Term Goals - 09/22/18 1007      PT LONG TERM GOAL #1   Title  Pt will increase Rt elbow strength and ROM to WNL. 4 weeks 10/20/18    Status  New      PT LONG TERM GOAL #2   Title  Pt will improve gripping strength on Rt to at least equal to left to improve function. 4 weeks 10/20/18      PT LONG TERM GOAL #3   Title  Pt will report overall less than 2/10 pain in Rt elbow with usual activity and allow return to work. 4 weeks 10/20/18      PT LONG TERM GOAL #4   Title  Pt will be I and compliant with HEP. 4 weeks 10/20/18     Status  New      PT LONG TERM GOAL #5   Title  Pt will improve FOTO to less than 37% limited.     Status  New            Plan - 09/29/18 1055    Clinical Impression Statement  pt reported increased soreness today and opted to hold off on TPDN. focused on manual techniques and mobs and demonstrated to pt how he can perform MTPR at home. worked on Occupational psychologist with heavy empahsis on eccentrics. continued Ionto to promote pain relief and trialed pre-wrap tennis elbow band to relieve tension. pt noted reduced pain end of session.     PT Treatment/Interventions  ADLs/Self Care Home Management;Cryotherapy;Electrical Stimulation;Iontophoresis 4mg /ml Dexamethasone;Moist Heat;Ultrasound;Therapeutic activities;Therapeutic exercise;Neuromuscular re-education;Patient/family education;Manual techniques;Dry needling;Passive range of motion;Taping;Vasopneumatic Device;Joint Manipulations    PT Next Visit Plan  assess response to IONTO, IASTM, gripping and eccentric strengthening. DN PRN, how was pre-wrap tennis elbow band?    Consulted and Agree with Plan of Care  Patient       Patient will benefit from skilled therapeutic intervention in order to improve the following deficits and impairments:  Decreased activity tolerance, Decreased endurance, Decreased range of motion, Decreased strength, Impaired flexibility, Pain  Visit Diagnosis: Pain in right elbow  Muscle weakness (generalized)     Problem List Patient Active Problem List   Diagnosis Date Noted  . Lateral epicondylitis of right elbow 07/31/2018  . Mixed hyperlipidemia 03/01/2014  . Essential hypertension, benign 11/11/2013    Starr Lake PT, DPT, LAT, ATC  09/29/18  10:59 AM      [facility] [address] [place], [place], [address] Phone: [phone]   Fax:  [phone]  Name: John Collins MRN: [id number] Date of Birth: [birth year]

## 2018-09-30 ENCOUNTER — Ambulatory Visit: Payer: 59 | Admitting: Physical Therapy

## 2018-09-30 ENCOUNTER — Encounter: Payer: Self-pay | Admitting: Physical Therapy

## 2018-09-30 DIAGNOSIS — M25521 Pain in right elbow: Secondary | ICD-10-CM | POA: Diagnosis not present

## 2018-09-30 DIAGNOSIS — M6281 Muscle weakness (generalized): Secondary | ICD-10-CM

## 2018-09-30 NOTE — Therapy (Signed)
[place], [place], [address] Phone: [phone]   Fax:  [phone]  Physical Therapy Treatment  Patient Details  Name: John Collins MRN: [id number] Date of Birth: 12-Jul-1960 Referring Provider (PT): Marlou Sa Tonna Corner, MD   Encounter Date: 09/30/2018  PT End of Session - 09/30/18 1238    Visit Number  6    Number of Visits  12    Date for PT Re-Evaluation  10/20/18    Authorization Type  UHC    PT Start Time  1230    PT Stop Time  1313    PT Time Calculation (min)  43 min    Activity Tolerance  Patient tolerated treatment well    Behavior During Therapy  [facility] for tasks assessed/performed       Past Medical History:  Diagnosis Date  . Cervical radiculopathy   . Hypertension     Past Surgical History:  Procedure Laterality Date  . COLONOSCOPY WITH PROPOFOL N/A 03/01/2014   Procedure: COLONOSCOPY WITH PROPOFOL;  Surgeon: John Fair, MD;  Location: [facility];  Service: Endoscopy;  Laterality: N/A;    There were no vitals filed for this visit.  Subjective Assessment - 09/30/18 1235    Subjective  Patient reports he was a little sore after the last visit. This morning ist is OK but sore when he tries to extend the arm.     Pertinent History  PMH:HTN, Hyperlipidemia, cervical radic    Limitations  Other (comment)    Diagnostic tests  XR of elbow negative, Per pt he had NCV test that showed no nerve damage.    Currently in Pain?  Yes    Pain Score  6     Pain Location  Elbow    Pain Orientation  Right    Pain Descriptors / Indicators  Aching    Pain Type  Acute pain    Pain Onset  More than a month ago    Pain Frequency  Constant    Aggravating Factors   direct pressure, extensing the elbow     Pain Relieving Factors  medication     Multiple Pain Sites  No                       [facility] Adult PT Treatment/Exercise - 09/30/18 0001      Self-Care   Other Self-Care  Comments   reviewed all of the patients exercises and stretches and talked to him about symptom management with his stretcxhes and exercises.       Elbow Exercises   Elbow Extension  Both;20 reps    Theraband Level (Elbow Extension)  Level 3 (Green)    Other elbow exercises  supination/ pronation with emphasis on eccentrics 1 x 50 (in supine)    Other elbow exercises  wrist extensor stretch 2 x 20 seconds, Tricep stretch 2 x 30 seconds      Shoulder Exercises: ROM/Strengthening   UBE (Upper Arm Bike)  L1 x 4 min    changing direction at 2 x min     Wrist Exercises   Wrist Extension Limitations  2x10 1 lb minor pain     Other wrist exercises  putty squeeze x15; putty key pinch fist 2 digits 2x10       Manual Therapy   Manual therapy comments  MTPR along the supinator / common extensors and distal tricep,    Joint Mobilization  Grade III Radial Head  AP glide with active supination     Soft tissue mobilization  TFM to extensor group and triceps; Maul Biceps release     Other Manual Therapy    pre-wrap compression band along proximal common extensors to relieve tenson off tendon   pt noted decreased pain            PT Education - 09/30/18 1237    Education Details  reviewed HEP and symptom mangement     Person(s) Educated  Patient    Methods  Explanation;Demonstration;Tactile cues;Verbal cues    Comprehension  Verbalized understanding;Returned demonstration          PT Long Term Goals - 09/30/18 1626      PT LONG TERM GOAL #1   Title  Pt will increase Rt elbow strength and ROM to WNL. 4 weeks 10/20/18    Period  Weeks    Status  New      PT LONG TERM GOAL #2   Title  Pt will improve gripping strength on Rt to at least equal to left to improve function. 4 weeks 10/20/18    Status  On-going      PT LONG TERM GOAL #3   Title  Pt will report overall less than 2/10 pain in Rt elbow with usual activity and allow return to work. 4 weeks 10/20/18    Status  On-going      PT  LONG TERM GOAL #4   Title  Pt will be I and compliant with HEP. 4 weeks 10/20/18    Status  On-going      PT LONG TERM GOAL #5   Title  Pt will improve FOTO to less than 37% limited.     Status  On-going            Plan - 09/30/18 1303    Clinical Impression Statement  Extensive education given on symtpom mangement today. He required cuing not to drive through his pain. Even when he was showing therapy what caused his pain he was going fast and hard into end ranges. Patient encouraged to stretch at home but not push past end ranges. Patient was also given putty for home and showed how to use it.     Personal Factors and Comorbidities  Comorbidity 1;Comorbidity 2;Profession    Comorbidities  HBP, HCL    Examination-Activity Limitations  Lift;Bend;Carry;Reach Overhead    Examination-Participation Restrictions  Cleaning;Community Activity;Yard Work;Driving;Other    Stability/Clinical Decision Making  Stable/Uncomplicated    Rehab Potential  Collins    PT Duration  4 weeks    PT Treatment/Interventions  ADLs/Self Care Home Management;Cryotherapy;Electrical Stimulation;Iontophoresis 4mg /ml Dexamethasone;Moist Heat;Ultrasound;Therapeutic activities;Therapeutic exercise;Neuromuscular re-education;Patient/family education;Manual techniques;Dry needling;Passive range of motion;Taping;Vasopneumatic Device;Joint Manipulations    PT Next Visit Plan  assess response to IONTO, , gripping and eccentric strengthening. DN PRN, how was pre-wrap tennis elbow band?    PT Home Exercise Plan  [id number]    Consulted and Agree with Plan of Care  Patient       Patient will benefit from skilled therapeutic intervention in order to improve the following deficits and impairments:  Decreased activity tolerance, Decreased endurance, Decreased range of motion, Decreased strength, Impaired flexibility, Pain  Visit Diagnosis: Pain in right elbow  Muscle weakness (generalized)     Problem List Patient Active  Problem List   Diagnosis Date Noted  . Lateral epicondylitis of right elbow 07/31/2018  . Mixed hyperlipidemia 03/01/2014  . Essential hypertension, benign 11/11/2013    Carney Living  09/30/2018, 4:37 PM  [place], [place], [address] Phone: [phone]   Fax:  [phone]  Name: John Collins MRN: [id number] Date of Birth: Mar 30, 1961

## 2018-10-01 ENCOUNTER — Other Ambulatory Visit: Payer: Self-pay

## 2018-10-01 ENCOUNTER — Ambulatory Visit: Payer: 59 | Admitting: Physical Therapy

## 2018-10-01 DIAGNOSIS — M6281 Muscle weakness (generalized): Secondary | ICD-10-CM

## 2018-10-01 DIAGNOSIS — M25521 Pain in right elbow: Secondary | ICD-10-CM

## 2018-10-01 NOTE — Therapy (Signed)
[place], [place], [address] Phone: [phone]   Fax:  [phone]  Physical Therapy Treatment  Patient Details  Name: John Collins MRN: [id number] Date of Birth: 05-17-1961 Referring Provider (PT): Marlou Sa Tonna Corner, MD   Encounter Date: 10/01/2018  PT End of Session - 10/01/18 1331    Visit Number  7    Number of Visits  12    Date for PT Re-Evaluation  10/20/18    Authorization Type  UHC    PT Start Time  1230    PT Stop Time  1315    PT Time Calculation (min)  45 min    Activity Tolerance  Patient tolerated treatment well    Behavior During Therapy  [facility] for tasks assessed/performed       Past Medical History:  Diagnosis Date  . Cervical radiculopathy   . Hypertension     Past Surgical History:  Procedure Laterality Date  . COLONOSCOPY WITH PROPOFOL N/A 03/01/2014   Procedure: COLONOSCOPY WITH PROPOFOL;  Surgeon: Garlan Fair, MD;  Location: [facility];  Service: Endoscopy;  Laterality: N/A;    There were no vitals filed for this visit.  Subjective Assessment - 10/01/18 1306    Subjective  Pt relays some soreness and burning still in his elbow    Currently in Pain?  Yes    Pain Score  5     Pain Location  Elbow    Pain Orientation  Right    Pain Descriptors / Indicators  Aching    Pain Type  Acute pain                       [facility] Adult PT Treatment/Exercise - 10/01/18 0001      Elbow Exercises   Elbow Extension  Right;20 reps    Elbow Extension Limitations  OH tricep ext 3 lbs    Other elbow exercises  supination/pronation and wrist ext with emphasis on eccentrics 3 lbs X 20 ea    Other elbow exercises  wrist extensor stretch 3 x 30 seconds, Tricep stretch 3 x 30 seconds      Shoulder Exercises: ROM/Strengthening   UBE (Upper Arm Bike)  L2 x 4 min       Wrist Exercises   Other wrist exercises  green dgrip 2X25      Modalities   Modalities  Ultrasound       Ultrasound   Ultrasound Location  Rt lateral elbow    Ultrasound Parameters  1.0 w/cm2, 3.3 MHz, 100% 8 min    Ultrasound Goals  Pain      Manual Therapy   Soft tissue mobilization  CFM and STM to lateral elbow and wrist extensors    Other Manual Therapy  KT tape lateral elbow             PT Education - 10/01/18 1331    Education Details  U.S and KT tape rationale and edu    Person(s) Educated  Patient    Methods  Explanation    Comprehension  Verbalized understanding          PT Long Term Goals - 09/30/18 1626      PT LONG TERM GOAL #1   Title  Pt will increase Rt elbow strength and ROM to WNL. 4 weeks 10/20/18    Period  Weeks    Status  New      PT LONG TERM GOAL #2  Title  Pt will improve gripping strength on Rt to at least equal to left to improve function. 4 weeks 10/20/18    Status  On-going      PT LONG TERM GOAL #3   Title  Pt will report overall less than 2/10 pain in Rt elbow with usual activity and allow return to work. 4 weeks 10/20/18    Status  On-going      PT LONG TERM GOAL #4   Title  Pt will be I and compliant with HEP. 4 weeks 10/20/18    Status  On-going      PT LONG TERM GOAL #5   Title  Pt will improve FOTO to less than 37% limited.     Status  On-going            Plan - 10/01/18 1332    Clinical Impression Statement  He was trialed with U.S. and KT tape both in efforts to decrease elbow pain, decrease inflammation, and reduce muscle strain to his lateral elbow. Continued to work on stretching, eccentric strengthening and gripping today to tolerance. Continue POC and assess his response to new interventions next session.    Personal Factors and Comorbidities  Comorbidity 1;Comorbidity 2;Profession    Comorbidities  HBP, HCL    Examination-Activity Limitations  Lift;Bend;Carry;Reach Overhead    Examination-Participation Restrictions  Cleaning;Community Activity;Yard Work;Driving;Other    Stability/Clinical Decision Making   Stable/Uncomplicated    Rehab Potential  Fair    PT Duration  4 weeks    PT Treatment/Interventions  ADLs/Self Care Home Management;Cryotherapy;Electrical Stimulation;Iontophoresis 4mg /ml Dexamethasone;Moist Heat;Ultrasound;Therapeutic activities;Therapeutic exercise;Neuromuscular re-education;Patient/family education;Manual techniques;Dry needling;Passive range of motion;Taping;Vasopneumatic Device;Joint Manipulations    PT Next Visit Plan  assess response to KT tape, U.S, , gripping and eccentric strengthening. DN PRN, how was pre-wrap tennis elbow band?    PT Home Exercise Plan  [id number]    Consulted and Agree with Plan of Care  Patient       Patient will benefit from skilled therapeutic intervention in order to improve the following deficits and impairments:  Decreased activity tolerance, Decreased endurance, Decreased range of motion, Decreased strength, Impaired flexibility, Pain  Visit Diagnosis: Pain in right elbow  Muscle weakness (generalized)     Problem List Patient Active Problem List   Diagnosis Date Noted  . Lateral epicondylitis of right elbow 07/31/2018  . Mixed hyperlipidemia 03/01/2014  . Essential hypertension, benign 11/11/2013    [name] 10/01/2018, 1:35 PM  [facility] [address] [place], [place], [address] Phone: [phone]   Fax:  [phone]  Name: John Collins MRN: [id number] Date of Birth: [birth year]

## 2018-10-02 ENCOUNTER — Other Ambulatory Visit: Payer: Self-pay | Admitting: Family Medicine

## 2018-10-06 ENCOUNTER — Ambulatory Visit: Payer: 59 | Attending: Orthopedic Surgery | Admitting: Physical Therapy

## 2018-10-06 ENCOUNTER — Encounter: Payer: Self-pay | Admitting: Physical Therapy

## 2018-10-06 ENCOUNTER — Other Ambulatory Visit: Payer: Self-pay

## 2018-10-06 DIAGNOSIS — M25521 Pain in right elbow: Secondary | ICD-10-CM

## 2018-10-06 DIAGNOSIS — M6281 Muscle weakness (generalized): Secondary | ICD-10-CM | POA: Diagnosis present

## 2018-10-06 NOTE — Therapy (Signed)
[place], [place], [address] Phone: [phone]   Fax:  [phone]  Physical Therapy Treatment  Patient Details  Name: John Collins MRN: [id number] Date of Birth: [birth year] Referring Provider (PT): Marlou Sa Tonna Corner, MD   Encounter Date: 10/06/2018  PT End of Session - 10/06/18 1235    Visit Number  8    Number of Visits  12    Date for PT Re-Evaluation  10/20/18    Authorization Type  UHC    PT Start Time  4431    PT Stop Time  1320    PT Time Calculation (min)  45 min    Activity Tolerance  Patient tolerated treatment well    Behavior During Therapy  [facility] for tasks assessed/performed       Past Medical History:  Diagnosis Date  . Cervical radiculopathy   . Hypertension     Past Surgical History:  Procedure Laterality Date  . COLONOSCOPY WITH PROPOFOL N/A 03/01/2014   Procedure: COLONOSCOPY WITH PROPOFOL;  Surgeon: Garlan Fair, MD;  Location: [facility];  Service: Endoscopy;  Laterality: N/A;    There were no vitals filed for this visit.  Subjective Assessment - 10/06/18 1237    Subjective  "I am really having some issue I am not sure why it isn't getting any better"     Diagnostic tests  XR of elbow negative, Per pt he had NCV test that showed no nerve damage.    Currently in Pain?  Yes    Pain Score  9     Pain Orientation  Right    Pain Descriptors / Indicators  Aching    Pain Type  Acute pain         [facility] PT Assessment - 10/06/18 0001      Assessment   Medical Diagnosis  Rt elbow pain/tennis elbow    Referring Provider (PT)  Marlou Sa Tonna Corner, MD    Hand Dominance  Right      Observation/Other Assessments   Focus on Therapeutic Outcomes (FOTO)   43% limited                   [facility] Adult PT Treatment/Exercise - 10/06/18 0001      Elbow Exercises   Other elbow exercises  wrist extensor stretch 3 x 30 seconds, Tricep stretch 3 x 30 seconds      Ultrasound   Ultrasound Location  Rt lateral elbow    Ultrasound Parameters  1.0 w/cm2, 3.3 MHz, 100% 8 min    Ultrasound Goals  Pain   stretching extensors throughout treatment     Manual Therapy   Manual Therapy  Taping    Manual therapy comments  skilled palpation and monitoring of pt throughout  TPDN    Soft tissue mobilization  CFM and STM to lateral elbow and wrist extensors    Other Manual Therapy  --    Kinesiotex  Inhibit Muscle      Kinesiotix   Inhibit Muscle   Bicep brachii, common extensors/ supinator       Trigger Point Dry Needling - 10/06/18 0001    Consent Given?  Yes    Education Handout Provided  Previously provided    Printmaker Performed with Dry Needling  Yes    E-stim with Dry Needling Details  x 10 min , CRP 18, intensity 1/2 past 2nd dot x 5 min, and increased to frist solid dot x 5 min  Extensor carpi radialis longus/brevis Response  Twitch response elicited;Palpable increased muscle length                PT Long Term Goals - 09/30/18 1626      PT LONG TERM GOAL #1   Title  Pt will increase Rt elbow strength and ROM to WNL. 4 weeks 10/20/18    Period  Weeks    Status  New      PT LONG TERM GOAL #2   Title  Pt will improve gripping strength on Rt to at least equal to left to improve function. 4 weeks 10/20/18    Status  On-going      PT LONG TERM GOAL #3   Title  Pt will report overall less than 2/10 pain in Rt elbow with usual activity and allow return to work. 4 weeks 10/20/18    Status  On-going      PT LONG TERM GOAL #4   Title  Pt will be I and compliant with HEP. 4 weeks 10/20/18    Status  On-going      PT LONG TERM GOAL #5   Title  Pt will improve FOTO to less than 37% limited.     Status  On-going            Plan - 10/06/18 1258    Clinical Impression Statement  pt reports increased pain at 9/10 today, but the KT tape does help. focused todays session on pain due to high pain level. continued TPDN combined  with E-stim, with IASTM And [place] to promote healing and assist with pain control. end of session pt noted pain dropped to 3/10. discussed with pt that if no progress is made in the remainder of visits then plan to refer him back to his MD.     PT Treatment/Interventions  ADLs/Self Care Home Management;Cryotherapy;Electrical Stimulation;Iontophoresis 4mg /ml Dexamethasone;Moist Heat;Ultrasound;Therapeutic activities;Therapeutic exercise;Neuromuscular re-education;Patient/family education;Manual techniques;Dry needling;Passive range of motion;Taping;Vasopneumatic Device;Joint Manipulations    PT Next Visit Plan  assess response to KT tape, U.S, , gripping and eccentric strengthening. DN PRN, how was pre-wrap tennis elbow band?       Patient will benefit from skilled therapeutic intervention in order to improve the following deficits and impairments:  Decreased activity tolerance, Decreased endurance, Decreased range of motion, Decreased strength, Impaired flexibility, Pain  Visit Diagnosis: Pain in right elbow  Muscle weakness (generalized)     Problem List Patient Active Problem List   Diagnosis Date Noted  . Lateral epicondylitis of right elbow 07/31/2018  . Mixed hyperlipidemia 03/01/2014  . Essential hypertension, benign 11/11/2013   Starr Lake PT, DPT, LAT, ATC  10/06/18  1:23 PM      [facility] [address] [place], [place], [address] Phone: [phone]   Fax:  [phone]  Name: John Collins MRN: [id number] Date of Birth: 08/29/1960

## 2018-10-08 ENCOUNTER — Other Ambulatory Visit: Payer: Self-pay

## 2018-10-08 ENCOUNTER — Ambulatory Visit: Payer: 59 | Admitting: Physical Therapy

## 2018-10-08 DIAGNOSIS — M25521 Pain in right elbow: Secondary | ICD-10-CM | POA: Diagnosis not present

## 2018-10-08 DIAGNOSIS — M6281 Muscle weakness (generalized): Secondary | ICD-10-CM

## 2018-10-08 NOTE — Therapy (Signed)
[place], [place], [address] Phone: [phone]   Fax:  [phone]  Physical Therapy Treatment  Patient Details  Name: John Collins MRN: [id number] Date of Birth: 10-01-1960 Referring Provider (PT): Marlou Sa Tonna Corner, MD   Encounter Date: 10/08/2018  PT End of Session - 10/08/18 1234    Visit Number  9    Number of Visits  12    Date for PT Re-Evaluation  10/20/18    Authorization Type  UHC    PT Start Time  7782    PT Stop Time  1314    PT Time Calculation (min)  43 min    Activity Tolerance  Patient tolerated treatment well    Behavior During Therapy  [facility] for tasks assessed/performed       Past Medical History:  Diagnosis Date  . Cervical radiculopathy   . Hypertension     Past Surgical History:  Procedure Laterality Date  . COLONOSCOPY WITH PROPOFOL N/A 03/01/2014   Procedure: COLONOSCOPY WITH PROPOFOL;  Surgeon: Garlan Fair, MD;  Location: [facility];  Service: Endoscopy;  Laterality: N/A;    There were no vitals filed for this visit.  Subjective Assessment - 10/08/18 1234    Subjective  "the last session really helped alot and I am only feeling 2-3 /10 pain today"     Diagnostic tests  XR of elbow negative, Per pt he had NCV test that showed no nerve damage.    Currently in Pain?  Yes    Pain Score  3     Pain Orientation  Right    Pain Type  Chronic pain    Pain Onset  More than a month ago    Pain Frequency  Intermittent    Aggravating Factors   direct pressure, extension of the elbow                       [facility] Adult PT Treatment/Exercise - 10/08/18 0001      Elbow Exercises   Other elbow exercises  brachioradialis 2 x 12 4#   focus on eccentrics   Other elbow exercises  wrist extensor stretch 3 x 30 seconds, Tricep stretch 3 x 30 seconds      Shoulder Exercises: ROM/Strengthening   UBE (Upper Arm Bike)  L2 x 4 min    changing direction at 2 min      Wrist Exercises   Wrist Extension  Strengthening;15 reps;Supine;Theraband   using yellow therabar     Ultrasound   Ultrasound Location  Rt lateral elbow    Ultrasound Parameters  1.2 w/cm2, 3.3 MHz, 100% 8 min    Ultrasound Goals  Pain      Manual Therapy   Joint Mobilization  Grade III Radial Head AP glide with active supination     Soft tissue mobilization  CFM and STM to lateral elbow and wrist extensors                  PT Long Term Goals - 09/30/18 1626      PT LONG TERM GOAL #1   Title  Pt will increase Rt elbow strength and ROM to WNL. 4 weeks 10/20/18    Period  Weeks    Status  New      PT LONG TERM GOAL #2   Title  Pt will improve gripping strength on Rt to at least equal to left to improve function. 4 weeks 10/20/18  Status  On-going      PT LONG TERM GOAL #3   Title  Pt will report overall less than 2/10 pain in Rt elbow with usual activity and allow return to work. 4 weeks 10/20/18    Status  On-going      PT LONG TERM GOAL #4   Title  Pt will be I and compliant with HEP. 4 weeks 10/20/18    Status  On-going      PT LONG TERM GOAL #5   Title  Pt will improve FOTO to less than 37% limited.     Status  On-going            Plan - 10/08/18 1316    Clinical Impression Statement  Mr Wishart reported significant relief since last session. Opted to hold off on DN and taping (at pt's request) and utilized [place] / IASTM technqieus. continued strengthening with emphasis on eccentrics which he tolerated well.     PT Next Visit Plan  KT tape PRNU.S, , gripping and eccentric strengthening. DN PRN, functional griping and bicep/tricep strengthening    Consulted and Agree with Plan of Care  Patient       Patient will benefit from skilled therapeutic intervention in order to improve the following deficits and impairments:  Decreased activity tolerance, Decreased endurance, Decreased range of motion, Decreased strength, Impaired flexibility, Pain  Visit  Diagnosis: Pain in right elbow  Muscle weakness (generalized)     Problem List Patient Active Problem List   Diagnosis Date Noted  . Lateral epicondylitis of right elbow 07/31/2018  . Mixed hyperlipidemia 03/01/2014  . Essential hypertension, benign 11/11/2013   Starr Lake PT, DPT, LAT, ATC  10/08/18  1:19 PM      [facility] [address] [place], [place], [address] Phone: [phone]   Fax:  [phone]  Name: John Collins MRN: [id number] Date of Birth: May 20, 1961

## 2018-10-10 ENCOUNTER — Ambulatory Visit: Payer: 59 | Admitting: Physical Therapy

## 2018-10-10 ENCOUNTER — Other Ambulatory Visit: Payer: Self-pay

## 2018-10-10 DIAGNOSIS — M6281 Muscle weakness (generalized): Secondary | ICD-10-CM

## 2018-10-10 DIAGNOSIS — M25521 Pain in right elbow: Secondary | ICD-10-CM

## 2018-10-10 NOTE — Therapy (Signed)
[place], [place], [address] Phone: [phone]   Fax:  [phone]  Physical Therapy Treatment  Patient Details  Name: John Collins MRN: [id number] Date of Birth: May 28, 1961 Referring Provider (PT): Marlou Sa Tonna Corner, MD   Encounter Date: 10/10/2018  PT End of Session - 10/10/18 1303    Visit Number  10    Number of Visits  12    Date for PT Re-Evaluation  10/20/18    Authorization Type  UHC    PT Start Time  1230    PT Stop Time  1314    PT Time Calculation (min)  44 min    Activity Tolerance  Patient tolerated treatment well    Behavior During Therapy  [facility] for tasks assessed/performed       Past Medical History:  Diagnosis Date  . Cervical radiculopathy   . Hypertension     Past Surgical History:  Procedure Laterality Date  . COLONOSCOPY WITH PROPOFOL N/A 03/01/2014   Procedure: COLONOSCOPY WITH PROPOFOL;  Surgeon: Garlan Fair, MD;  Location: [facility];  Service: Endoscopy;  Laterality: N/A;    There were no vitals filed for this visit.  Subjective Assessment - 10/10/18 1257    Subjective  I have burning sensation in my elbow and still have trouble grasping things    Diagnostic tests  XR of elbow negative, Per pt he had NCV test that showed no nerve damage.    Currently in Pain?  Yes    Pain Score  5     Pain Location  Elbow    Pain Orientation  Right    Pain Descriptors / Indicators  Burning    Pain Type  Chronic pain                       [facility] Adult PT Treatment/Exercise - 10/10/18 0001      Elbow Exercises   Elbow Extension  Right;20 reps    Theraband Level (Elbow Extension)  Level 3 (Green)    Other elbow exercises  brachioradialis 2 x 15 4#    Other elbow exercises  wrist extensor stretch and wrist flexor stretch 3 x 30 seconds ea      Shoulder Exercises: ROM/Strengthening   UBE (Upper Arm Bike)  L2 x 5 min       Wrist Exercises   Other wrist  exercises  therabar yellow wrist ext, flex, pro, sup for 20 times each on Rt    Other wrist exercises  green Dgrip 3X20 reps on Rt, baps board screw on/off for radial/ulnar deviation X 4      Ultrasound   Ultrasound Location  Rt lateral elbow    Ultrasound Parameters  1.2 w/cm2, 3.52mhz, 100% 8 min    Ultrasound Goals  Pain      Manual Therapy   Joint Mobilization  Grade III Radial Head AP glide with active supination     Soft tissue mobilization  CFM, IASTM and STM to lateral elbow and wrist extensors                  PT Long Term Goals - 09/30/18 1626      PT LONG TERM GOAL #1   Title  Pt will increase Rt elbow strength and ROM to WNL. 4 weeks 10/20/18    Period  Weeks    Status  New      PT LONG TERM GOAL #2   Title  Pt will improve gripping strength on Rt to at least equal to left to improve function. 4 weeks 10/20/18    Status  On-going      PT LONG TERM GOAL #3   Title  Pt will report overall less than 2/10 pain in Rt elbow with usual activity and allow return to work. 4 weeks 10/20/18    Status  On-going      PT LONG TERM GOAL #4   Title  Pt will be I and compliant with HEP. 4 weeks 10/20/18    Status  On-going      PT LONG TERM GOAL #5   Title  Pt will improve FOTO to less than 37% limited.     Status  On-going            Plan - 10/10/18 1303    Clinical Impression Statement  He is making strength improvements but continues to have Rt elbow pain and burning sensations that improve with PT but only intermittently and pain is aggravated by gripping and carrying activities such as picking up his young children. Session again focused on strength and reducing pain/inflammation with Manual therapy and [place]. He has 2 more visits left on POC, recommending to continue these and if he still has irritable symptoms to refer back to MD after that.     PT Next Visit Plan  KT tape PRNU.S, , gripping and eccentric strengthening. DN PRN, functional griping and bicep/tricep  strengthening    PT Home Exercise Plan  [id number]    Consulted and Agree with Plan of Care  Patient       Patient will benefit from skilled therapeutic intervention in order to improve the following deficits and impairments:  Decreased activity tolerance, Decreased endurance, Decreased range of motion, Decreased strength, Impaired flexibility, Pain  Visit Diagnosis: Pain in right elbow  Muscle weakness (generalized)     Problem List Patient Active Problem List   Diagnosis Date Noted  . Lateral epicondylitis of right elbow 07/31/2018  . Mixed hyperlipidemia 03/01/2014  . Essential hypertension, benign 11/11/2013    [name] 10/10/2018, 1:20 PM  [facility] [address] [place], [place], [address] Phone: [phone]   Fax:  [phone]  Name: Valgene Deloatch MRN: [id number] Date of Birth: June 08, 1960

## 2018-10-13 ENCOUNTER — Other Ambulatory Visit: Payer: Self-pay

## 2018-10-13 ENCOUNTER — Encounter: Payer: Self-pay | Admitting: Physical Therapy

## 2018-10-13 ENCOUNTER — Ambulatory Visit: Payer: 59 | Admitting: Physical Therapy

## 2018-10-13 DIAGNOSIS — M25521 Pain in right elbow: Secondary | ICD-10-CM | POA: Diagnosis not present

## 2018-10-13 DIAGNOSIS — M6281 Muscle weakness (generalized): Secondary | ICD-10-CM

## 2018-10-13 NOTE — Therapy (Signed)
[place], [place], [address] Phone: [phone]   Fax:  [phone]  Physical Therapy Treatment  Patient Details  Name: John Collins MRN: [id number] Date of Birth: 1960/08/09 Referring Provider (PT): Marlou Sa Tonna Corner, MD   Encounter Date: 10/13/2018  PT End of Session - 10/13/18 1341    Visit Number  11    Number of Visits  12    Date for PT Re-Evaluation  10/20/18    PT Start Time  1230    PT Stop Time  1314    PT Time Calculation (min)  44 min    Activity Tolerance  Patient tolerated treatment well    Behavior During Therapy  [facility] for tasks assessed/performed       Past Medical History:  Diagnosis Date  . Cervical radiculopathy   . Hypertension     Past Surgical History:  Procedure Laterality Date  . COLONOSCOPY WITH PROPOFOL N/A 03/01/2014   Procedure: COLONOSCOPY WITH PROPOFOL;  Surgeon: Garlan Fair, MD;  Location: [facility];  Service: Endoscopy;  Laterality: N/A;    There were no vitals filed for this visit.  Subjective Assessment - 10/13/18 1233    Subjective  "I think I am getting better, but I still get sore with wtraigthening the elbow    Currently in Pain?  Yes    Pain Score  4     Pain Orientation  Right    Pain Frequency  Intermittent         [facility] PT Assessment - 10/13/18 0001      Assessment   Medical Diagnosis  Rt elbow pain/tennis elbow    Referring Provider (PT)  Meredith Pel, MD                   [facility] Adult PT Treatment/Exercise - 10/13/18 0001      Elbow Exercises   Other elbow exercises  brachioradialis 2 x 15 4#    Other elbow exercises  eccentric elbow extension with manual resistance focusing on mid range to full extension 2 x 10      Shoulder Exercises: Stretch   Other Shoulder Stretches  tricep stretch 2 x 30 sec      Wrist Exercises   Wrist Extension  Strengthening;15 reps;Supine;Theraband    Other wrist exercises  wrist  extension eccentrics 2 x 10 with manual resistance 2 x 10      Ultrasound   Ultrasound Location  Rt lateral elbow    Ultrasound Parameters  1.2 w/cm2, 3.62mhz, 100% 8 min   76min on common extensor, 4 at proximal anconeus   Ultrasound Goals  Pain      Manual Therapy   Manual therapy comments  skilled palpation and monitoring of pt throughout  TPDN    Joint Mobilization  Grade III Radial Head AP glide with active supination     Soft tissue mobilization   IASTM and STM to lateral elbow and wrist extensors with active release techniques       Trigger Point Dry Needling - 10/13/18 0001    Consent Given?  Yes    Other Dry Needling  Anconeus    Extensor carpi radialis longus/brevis Response  Twitch response elicited;Palpable increased muscle length                PT Long Term Goals - 09/30/18 1626      PT LONG TERM GOAL #1   Title  Pt will increase Rt elbow  strength and ROM to WNL. 4 weeks 10/20/18    Period  Weeks    Status  New      PT LONG TERM GOAL #2   Title  Pt will improve gripping strength on Rt to at least equal to left to improve function. 4 weeks 10/20/18    Status  On-going      PT LONG TERM GOAL #3   Title  Pt will report overall less than 2/10 pain in Rt elbow with usual activity and allow return to work. 4 weeks 10/20/18    Status  On-going      PT LONG TERM GOAL #4   Title  Pt will be I and compliant with HEP. 4 weeks 10/20/18    Status  On-going      PT LONG TERM GOAL #5   Title  Pt will improve FOTO to less than 37% limited.     Status  On-going            Plan - 10/13/18 1341    Clinical Impression Statement  pt continues to have R elbow pain/ burning located at the olecranon process. Continued TPDN focusing on common extensors and anconeus. continued IATM and [place] to promote pain relief, focused strengtheing on manual resistance eccentrics. discussed with pt that if no progress is noted on next session then plan to discharge pt and refer him back to  his MD for further assessment which he agreed with.     PT Next Visit Plan  KT tape PRNU.S, , gripping and eccentric strengthening. DN PRN, functional griping and bicep/tricep strengthening, if no progress made then D/C    Consulted and Agree with Plan of Care  Patient       Patient will benefit from skilled therapeutic intervention in order to improve the following deficits and impairments:  Decreased activity tolerance, Decreased endurance, Decreased range of motion, Decreased strength, Impaired flexibility, Pain  Visit Diagnosis: Pain in right elbow  Muscle weakness (generalized)     Problem List Patient Active Problem List   Diagnosis Date Noted  . Lateral epicondylitis of right elbow 07/31/2018  . Mixed hyperlipidemia 03/01/2014  . Essential hypertension, benign 11/11/2013   Starr Lake PT, DPT, LAT, ATC  10/13/18  1:44 PM      [facility] [address] [place], [place], [address] Phone: [phone]   Fax:  [phone]  Name: John Collins MRN: [id number] Date of Birth: 10/16/1960

## 2018-10-14 ENCOUNTER — Encounter: Payer: Self-pay | Admitting: Orthopedic Surgery

## 2018-10-15 ENCOUNTER — Encounter: Payer: Self-pay | Admitting: Physical Therapy

## 2018-10-15 ENCOUNTER — Ambulatory Visit: Payer: 59 | Admitting: Physical Therapy

## 2018-10-15 ENCOUNTER — Other Ambulatory Visit: Payer: Self-pay

## 2018-10-15 DIAGNOSIS — M25521 Pain in right elbow: Secondary | ICD-10-CM | POA: Diagnosis not present

## 2018-10-15 DIAGNOSIS — M6281 Muscle weakness (generalized): Secondary | ICD-10-CM

## 2018-10-15 NOTE — Therapy (Signed)
[place], [place], [address] Phone: [phone]   Fax:  [phone]  Physical Therapy Treatment / Re-certification  Patient Details  Name: John Collins MRN: [id number] Date of Birth: 1961/04/12 Referring Provider (PT): Marlou Sa Tonna Corner, MD   Encounter Date: 10/15/2018  PT End of Session - 10/15/18 1239    Visit Number  12    Number of Visits  15    Date for PT Re-Evaluation  11/12/18    Authorization Type  UHC    PT Start Time  1233    PT Stop Time  1313    PT Time Calculation (min)  40 min    Activity Tolerance  Patient tolerated treatment well    Behavior During Therapy  [facility] for tasks assessed/performed       Past Medical History:  Diagnosis Date  . Cervical radiculopathy   . Hypertension     Past Surgical History:  Procedure Laterality Date  . COLONOSCOPY WITH PROPOFOL N/A 03/01/2014   Procedure: COLONOSCOPY WITH PROPOFOL;  Surgeon: Garlan Fair, MD;  Location: [facility];  Service: Endoscopy;  Laterality: N/A;    There were no vitals filed for this visit.  Subjective Assessment - 10/15/18 1238    Subjective  " I am not in pain today, I just have stiffness in the that area"     Currently in Pain?  Yes    Pain Score  0-No pain    Pain Orientation  Right    Aggravating Factors   direct pressure         [facility] PT Assessment - 10/15/18 0001      Assessment   Medical Diagnosis  Rt elbow pain/tennis elbow    Referring Provider (PT)  Meredith Pel, MD    Hand Dominance  Right      AROM   Right Elbow Flexion  143    Right Elbow Extension  2      Strength   Right Elbow Flexion  5/5    Right Elbow Extension  5/5    Right Hand Grip (lbs)  46.6   45,47,48   Left Hand Grip (lbs)  88   85,90,89                  [facility] Adult PT Treatment/Exercise - 10/15/18 0001      Elbow Exercises   Other elbow exercises  brachioradialis 2 x 15 with blue theraband. using  LUE for concetnric and focusing RUE eccentric only    Other elbow exercises  eccentric elbow extension 2 x 15 with blue theraband, concentric with LUE and focusing RUE on eccentric lengthening.      Shoulder Exercises: ROM/Strengthening   UBE (Upper Arm Bike)  L3 x 6 min    changind direction at 3 min     Shoulder Exercises: Stretch   Other Shoulder Stretches  tricep stretch 2 x 30 sec   leaning into wall to promote increased stretch     Wrist Exercises   Other wrist exercises  wrist extensor stretch 2 x 30 sec      Manual Therapy   Joint Mobilization  Grade III with gripping     pt's grip improved from 46# to 66# with no pain   Soft tissue mobilization   IASTM and STM to lateral elbow and wrist extensors with active release techniques             PT Education - 10/15/18 1319  Education Details  reviewed previous HEP, and updated verbally for tricep and brachioradialis eccentric strengthening. discussed updated POC.    Person(s) Educated  Patient    Methods  Explanation;Verbal cues    Comprehension  Verbalized understanding;Verbal cues required          PT Long Term Goals - 10/15/18 1243      PT LONG TERM GOAL #1   Title  Pt will increase Rt elbow strength and ROM to WNL. 4 weeks 10/20/18    Period  Weeks    Status  Achieved      PT LONG TERM GOAL #2   Title  Pt will improve gripping strength on Rt to at least equal to left to improve function. 4 weeks 10/20/18    Baseline  right Grip 46, L grip 88    Period  Weeks    Status  On-going      PT LONG TERM GOAL #3   Title  Pt will report overall less than 2/10 pain in Rt elbow with usual activity and allow return to work. 4 weeks 10/20/18    Baseline  no pain today, but hasn't return to work yet    Period  Weeks    Status  On-going      PT LONG TERM GOAL #4   Title  Pt will be I and compliant with HEP. 4 weeks 10/20/18    Period  Weeks    Status  On-going      PT LONG TERM GOAL #5   Title  Pt will improve FOTO  to less than 37% limited.     Baseline  43% limited    Period  Weeks    Status  On-going            Plan - 10/15/18 1314    Clinical Impression Statement  Mr Mittelstaedt is making good progress with physical therapy increasing ROM and strength. He does demonstrate limitation of grip strength compared bil but is gradually improving. focused on strengthening and elbow lateral mobs with gripping which notably increased his grip strength by 20# and was pain free. plan to continue with current POC work on elbow strengthened and controling pain PRN and work on finalizing HEP and discharge.    Rehab Potential  Good    PT Frequency  1x / week    PT Duration  3 weeks    PT Treatment/Interventions  ADLs/Self Care Home Management;Cryotherapy;Electrical Stimulation;Iontophoresis 4mg /ml Dexamethasone;Moist Heat;Ultrasound;Therapeutic activities;Therapeutic exercise;Neuromuscular re-education;Patient/family education;Manual techniques;Dry needling;Passive range of motion;Taping;Vasopneumatic Device;Joint Manipulations    PT Next Visit Plan  gripping and eccentric strengthening. DN PRN, functional griping and bicep/tricep strengthening, KT tape/US/ DN PRN    PT Home Exercise Plan  [id number], tricep eccentric extension, brachioradialis eccentrics    Consulted and Agree with Plan of Care  Patient       Patient will benefit from skilled therapeutic intervention in order to improve the following deficits and impairments:  Decreased activity tolerance, Decreased endurance, Decreased range of motion, Decreased strength, Impaired flexibility, Pain  Visit Diagnosis: Pain in right elbow  Muscle weakness (generalized)     Problem List Patient Active Problem List   Diagnosis Date Noted  . Lateral epicondylitis of right elbow 07/31/2018  . Mixed hyperlipidemia 03/01/2014  . Essential hypertension, benign 11/11/2013   Starr Lake PT, DPT, LAT, ATC  10/15/18  1:26 PM      [place] [facility] [address] [place], [place], [address] Phone: [phone]  Fax:  [phone]  Name: John Collins MRN: [id number] Date of Birth: 1961-04-22

## 2018-10-15 NOTE — Telephone Encounter (Signed)
[place] for rov 5/20

## 2018-10-22 ENCOUNTER — Ambulatory Visit: Payer: 59 | Admitting: Orthopedic Surgery

## 2018-10-22 ENCOUNTER — Ambulatory Visit: Payer: 59 | Admitting: Physical Therapy

## 2018-10-24 ENCOUNTER — Other Ambulatory Visit: Payer: Self-pay

## 2018-10-24 ENCOUNTER — Ambulatory Visit: Payer: 59 | Admitting: Orthopedic Surgery

## 2018-10-29 ENCOUNTER — Other Ambulatory Visit: Payer: Self-pay

## 2018-10-29 ENCOUNTER — Ambulatory Visit: Payer: 59 | Admitting: Physical Therapy

## 2018-10-29 ENCOUNTER — Encounter: Payer: Self-pay | Admitting: Physical Therapy

## 2018-10-29 DIAGNOSIS — M25521 Pain in right elbow: Secondary | ICD-10-CM

## 2018-10-29 DIAGNOSIS — M6281 Muscle weakness (generalized): Secondary | ICD-10-CM

## 2018-10-29 NOTE — Therapy (Signed)
[place], [place], [address] Phone: [phone]   Fax:  [phone]  Physical Therapy Treatment / Discharge summary  Patient Details  Name: John Collins MRN: [id number] Date of Birth: [birth year] Referring Provider (PT): Marlou Sa Tonna Corner, MD   Encounter Date: 10/29/2018  PT End of Session - 10/29/18 1232    Visit Number  13    Number of Visits  15    Date for PT Re-Evaluation  11/12/18    Authorization Type  UHC    PT Start Time  1232    PT Stop Time  1314    PT Time Calculation (min)  42 min    Activity Tolerance  Patient tolerated treatment well    Behavior During Therapy  [facility] for tasks assessed/performed       Past Medical History:  Diagnosis Date  . Cervical radiculopathy   . Hypertension     Past Surgical History:  Procedure Laterality Date  . COLONOSCOPY WITH PROPOFOL N/A 03/01/2014   Procedure: COLONOSCOPY WITH PROPOFOL;  Surgeon: Garlan Fair, MD;  Location: [facility];  Service: Endoscopy;  Laterality: N/A;    There were no vitals filed for this visit.  Subjective Assessment - 10/29/18 1233    Subjective  "I was doing pretty good until about 2 days ago. I feel like I have gotten a knot in the outside of the elbow which occurs mostly after I have been asleep for a while"    Currently in Pain?  Yes    Pain Score  0-No pain    Pain Orientation  Right    Pain Descriptors / Indicators  Aching   stiff/ tight   Pain Type  Chronic pain    Aggravating Factors   extending         [facility] PT Assessment - 10/29/18 0001      Observation/Other Assessments   Focus on Therapeutic Outcomes (FOTO)   40% limited      AROM   Right Elbow Flexion  143    Right Elbow Extension  2      Strength   Right Elbow Flexion  5/5    Right Elbow Extension  5/5    Right Hand Grip (lbs)  51.3   50,52,52                  [facility] Adult PT Treatment/Exercise - 10/29/18 0001      Self-Care   Self-Care  Posture    Posture  while sleeping avoiding extreme end ranges of flexion/ extension to prevent muscle tightness / stiffness    Other Self-Care Comments   MTPR techniques and cross friction massage benefits      Elbow Exercises   Forearm Pronation  Strengthening;Bar weights/barbell;15 reps;Right   4#   Other elbow exercises  hammer curl 2 x 15 4#    Other elbow exercises  eccentric elbow extension 2 x 15 with blue theraband, concentric with LUE and focusing RUE on eccentric lengthening.      Shoulder Exercises: ROM/Strengthening   UBE (Upper Arm Bike)  L2 x 4 min    changing direction at 2 min     Shoulder Exercises: Stretch   Other Shoulder Stretches  tricep stretch 2 x 30 sec      Wrist Exercises   Other wrist exercises  wrist extensor stretch 2 x 30 sec, wrist flexor stretching 2 x 30 sec  PT Education - 10/29/18 1317    Education Details  reviewed HEP and importance of consistency and to alot of time to promote continued strengthening to return to previous level of function    Person(s) Educated  Patient    Methods  Explanation;Verbal cues    Comprehension  Verbalized understanding;Verbal cues required          PT Long Term Goals - 10/29/18 1308      PT LONG TERM GOAL #1   Title  Pt will increase Rt elbow strength and ROM to WNL. 4 weeks 10/20/18    Period  Weeks    Status  Achieved      PT LONG TERM GOAL #2   Title  Pt will improve gripping strength on Rt to at least equal to left to improve function. 4 weeks 10/20/18    Period  Weeks    Status  Partially Met      PT LONG TERM GOAL #3   Title  Pt will report overall less than 2/10 pain in Rt elbow with usual activity and allow return to work. 4 weeks 10/20/18    Period  Weeks    Status  Achieved      PT LONG TERM GOAL #4   Title  Pt will be I and compliant with HEP. 4 weeks 10/20/18    Period  Weeks    Status  Achieved      PT LONG TERM GOAL #5   Title  Pt will improve  FOTO to less than 37% limited.     Baseline  40% limited    Period  Weeks    Status  Partially Met            Plan - 10/29/18 1321    Clinical Impression Statement  pt notes some soreness in the elbow but reports it is mostly stiffness which is after he has been sleeping. trialed pt out with no PT for the last 2 weeks which he has been able ot maintain ROM and strength and notes improvement in his grip strength. discussed at length with pt it takes time to build strength importance is consistency which he understood. He did all exericses and self massage reporting improvement in soreness/ stiffness. He met or partially met all goals today and is able to maintain and progress current level of function independently and will be discharged from PT today.     PT Treatment/Interventions  ADLs/Self Care Home Management;Cryotherapy;Electrical Stimulation;Iontophoresis 44m/ml Dexamethasone;Moist Heat;Ultrasound;Therapeutic activities;Therapeutic exercise;Neuromuscular re-education;Patient/family education;Manual techniques;Dry needling;Passive range of motion;Taping;Vasopneumatic Device;Joint Manipulations    PT Next Visit Plan  d/C    PT Home Exercise Plan  [id number], tricep eccentric extension, brachioradialis eccentrics    Consulted and Agree with Plan of Care  Patient       Patient will benefit from skilled therapeutic intervention in order to improve the following deficits and impairments:  Decreased activity tolerance, Decreased endurance, Decreased range of motion, Decreased strength, Impaired flexibility, Pain  Visit Diagnosis: Pain in right elbow  Muscle weakness (generalized)     Problem List Patient Active Problem List   Diagnosis Date Noted  . Lateral epicondylitis of right elbow 07/31/2018  . Mixed hyperlipidemia 03/01/2014  . Essential hypertension, benign 11/11/2013    LStarr Lake5/27/2020, 1:24 PM  [facility] [address]Phone: [phone]  Fax:  [phone] Name: John OsterhoutMRN: [id number] of Birth: 1May 11, 1962  PHYSICAL THERAPY DISCHARGE SUMMARY  Visits from Start of Care: 13  Current functional level related to goals / functional outcomes: See goals, FOTO 40% limited   Remaining deficits: Intermittent stiffness/ soreness in the elbow, mild weakness with grip compared bil.    Education / Equipment: HEP, theraband, posture, lifting mechanics  Plan: Patient agrees to discharge.  Patient goals were met. Patient is being discharged due to meeting the stated rehab goals.  ?????         Delvin Hedeen PT, DPT, LAT, ATC  10/29/18  1:25 PM

## 2018-10-30 ENCOUNTER — Ambulatory Visit: Payer: 59 | Admitting: Orthopedic Surgery

## 2018-11-03 ENCOUNTER — Ambulatory Visit: Payer: 59 | Admitting: Orthopedic Surgery

## 2018-11-10 ENCOUNTER — Encounter: Payer: Self-pay | Admitting: Orthopedic Surgery

## 2018-11-10 ENCOUNTER — Ambulatory Visit (INDEPENDENT_AMBULATORY_CARE_PROVIDER_SITE_OTHER): Payer: 59 | Admitting: Orthopedic Surgery

## 2018-11-10 ENCOUNTER — Other Ambulatory Visit: Payer: Self-pay

## 2018-11-10 DIAGNOSIS — M7711 Lateral epicondylitis, right elbow: Secondary | ICD-10-CM | POA: Diagnosis not present

## 2018-11-10 NOTE — Progress Notes (Signed)
Office Visit Note   Patient: John Collins           Date of Birth: 1960-08-10           MRN: [id number] Visit Date: 11/10/2018 Requested by: Charlott Rakes, MD [place], [place] [address] PCP: Charlott Rakes, MD  Subjective: Chief Complaint  Patient presents with  . Right Elbow - Follow-up    HPI: Patient presents for follow-up of his right elbow.  He finished therapy and was discharged from therapy.  In general he is doing better.  Not completely pain-free but is improving.  Decision point today was for or against MRI scanning of that elbow.  Since he is improving with therapy I think we can hold off on that for now.  Still has pain which localizes to that lateral epicondyle region.  He does do very vigorous work at the [employer].  He has been off now for over 3 months.              ROS: All systems reviewed are negative as they relate to the chief complaint within the history of present illness.  Patient denies  fevers or chills.   Assessment & Plan: Visit Diagnoses: No diagnosis found.  Plan: Impression is right elbow pain which looks like lateral epicondylitis.  Radiographs pretty unremarkable.  He has had therapy and a nerve conduction study as well.  He is improving with therapy and so I do not think MRI indicated at this time.  I would like for him to try to get back to work and if he is symptomatic with that return to work then MRI scanning would be indicated.  I think this may be something that he has to live with until it burns itself out and that can take up to 9 to 12 months in my experience.  We discussed also platelet rich plasma injection which might take upwards of 2 to 3 months to assist but that something that he wants to hold off on as well.  I will see him back as needed.  Follow-Up Instructions: No follow-ups on file.   Orders:  No orders of the defined types were placed in this encounter.  No orders of the defined types were placed in this  encounter.     Procedures: No procedures performed   Clinical Data: No additional findings.  Objective: Vital Signs: There were no vitals taken for this visit.  Physical Exam:   Constitutional: Patient appears well-developed HEENT:  Head: Normocephalic Eyes:EOM are normal Neck: Normal range of motion Cardiovascular: Normal rate Pulmonary/chest: Effort normal Neurologic: Patient is alert Skin: Skin is warm Psychiatric: Patient has normal mood and affect    Ortho Exam: Ortho exam demonstrates pretty reasonable range of motion.  I think he may be off of full extension about 3 degrees right versus left.  He has good EPL FPL interosseous wrist flexion extension strength.  No real pain localizing to that lateral epicondyle with wrist extension.  Radial pulse intact bilaterally.  No real discrete tenderness of the biceps tendon or triceps tendon on the right versus left.  Very minimal tenderness over the lateral epicondyle as well as within the radial PIN supinator region.  Specialty Comments:  No specialty comments available.  Imaging: No results found.   PMFS History: Patient Active Problem List   Diagnosis Date Noted  . Lateral epicondylitis of right elbow 07/31/2018  . Mixed hyperlipidemia 03/01/2014  . Essential hypertension, benign 11/11/2013  Past Medical History:  Diagnosis Date  . Cervical radiculopathy   . Hypertension     Family History  Problem Relation Age of Onset  . Thyroid disease Mother     Past Surgical History:  Procedure Laterality Date  . COLONOSCOPY WITH PROPOFOL N/A 03/01/2014   Procedure: COLONOSCOPY WITH PROPOFOL;  Surgeon: Garlan Fair, MD;  Location: [facility];  Service: Endoscopy;  Laterality: N/A;   Social History   Occupational History  . Not on file  Tobacco Use  . Smoking status: Never Smoker  . Smokeless tobacco: Never Used  Substance and Sexual Activity  . Alcohol use: Yes    Comment: rare to occ. monthly  . Drug  use: No  . Sexual activity: Not on file

## 2018-11-14 ENCOUNTER — Telehealth: Payer: Self-pay | Admitting: Orthopedic Surgery

## 2018-11-14 NOTE — Telephone Encounter (Signed)
Received a call from [facility] w/ [place]. Needing copy of assessment form from 4/24 and work note from 4/8. I faxed to her [phone], callback [phone]

## 2018-11-17 ENCOUNTER — Other Ambulatory Visit: Payer: Self-pay

## 2018-11-17 ENCOUNTER — Encounter: Payer: Self-pay | Admitting: Orthopedic Surgery

## 2018-11-17 DIAGNOSIS — M25521 Pain in right elbow: Secondary | ICD-10-CM

## 2018-11-17 NOTE — Telephone Encounter (Signed)
If he is having this much pain in his elbow I would favor MRI scan right elbow to evaluate source of pain thanks

## 2018-11-20 NOTE — Telephone Encounter (Signed)
We need to wait to see what the MRI scan shows before assigning restrictions

## 2018-11-21 ENCOUNTER — Telehealth: Payer: Self-pay

## 2018-11-21 NOTE — Telephone Encounter (Signed)
atty office is wanting pt set up for MRI and seen back in the office by Dr, Marlou Sa to go over the results all by 12/03/18 because they are scheduled for mediation that day. I went ahead and made an appt for the post MRI appt yesterday when the atty called to request this. It was made for 6/29 in the hopes the MRI can be done before this date. Verdis Frederickson emailed today asking if I could email her the order (signed release from pt was with the email) so they could see what they could do about getting him scheduled somewhere for MRI asap.

## 2018-12-01 ENCOUNTER — Ambulatory Visit: Payer: 59 | Admitting: Orthopedic Surgery

## 2018-12-02 ENCOUNTER — Encounter: Payer: Self-pay | Admitting: Orthopedic Surgery

## 2018-12-04 ENCOUNTER — Ambulatory Visit: Payer: Self-pay | Admitting: Orthopedic Surgery

## 2018-12-05 ENCOUNTER — Ambulatory Visit
Admission: RE | Admit: 2018-12-05 | Discharge: 2018-12-05 | Disposition: A | Discharge status: Home / Self Care | Payer: 59 | Source: Ambulatory Visit | Attending: Orthopedic Surgery | Admitting: Orthopedic Surgery

## 2018-12-05 ENCOUNTER — Other Ambulatory Visit: Payer: Self-pay

## 2018-12-05 DIAGNOSIS — M25521 Pain in right elbow: Secondary | ICD-10-CM

## 2018-12-08 ENCOUNTER — Ambulatory Visit (INDEPENDENT_AMBULATORY_CARE_PROVIDER_SITE_OTHER): Payer: Self-pay | Admitting: Orthopedic Surgery

## 2018-12-08 ENCOUNTER — Encounter: Payer: Self-pay | Admitting: Orthopedic Surgery

## 2018-12-08 ENCOUNTER — Other Ambulatory Visit: Payer: Self-pay

## 2018-12-08 DIAGNOSIS — M25521 Pain in right elbow: Secondary | ICD-10-CM

## 2018-12-08 DIAGNOSIS — M7711 Lateral epicondylitis, right elbow: Secondary | ICD-10-CM

## 2018-12-08 MED ORDER — NABUMETONE 500 MG PO TABS: ORAL_TABLET | ORAL | 0 refills | Status: DC

## 2018-12-09 ENCOUNTER — Encounter: Payer: Self-pay | Admitting: Orthopedic Surgery

## 2018-12-09 NOTE — Progress Notes (Signed)
Office Visit Note   Patient: John Collins           Date of Birth: [birth year]           MRN: [id number] Visit Date: 12/08/2018 Requested by: Charlott Rakes, MD [place],  [place] [address] PCP: Charlott Rakes, MD  Subjective: Chief Complaint  Patient presents with  . Follow-up    HPI: Patient presents for follow-up of right elbow pain.  Since have seen him he is had an MRI scan.  That scan shows moderate arthritis along with tendinopathy and tendinosis of the common extensor origin.  Since have seen him he has lost his job.  His elbow symptoms are about the same.              ROS: All systems reviewed are negative as they relate to the chief complaint within the history of present illness.  Patient denies  fevers or chills.   Assessment & Plan: Visit Diagnoses:  1. Right elbow pain   2. Right tennis elbow     Plan: Impression is moderate arthritis in that right elbow with but without significant loss of range of motion.  He also has common extensor tendinosis.  Discussed with him today about current treatment regimen which supports platelet rich plasma injection into that area under ultrasound guidance.  That would potentially be an out-of-pocket expense for him which she does not want to incur.  Instead we could try physical therapy and anti-inflammatories.  Tennis elbow strap also.  This does not look like nerve compression at this time.  I think that the tendinosis is more symptomatic for him than the arthritis.  Follow-Up Instructions: Return if symptoms worsen or fail to improve.   Orders:  Orders Placed This Encounter  Procedures  . Ambulatory referral to Physical Therapy   Meds ordered this encounter  Medications  . nabumetone (RELAFEN) 500 MG tablet    Sig: 1 po daily x 30 days-d/c other NSAIDS    Dispense:  60 tablet    Refill:  0      Procedures: No procedures performed   Clinical Data: No additional findings.  Objective: Vital  Signs: There were no vitals taken for this visit.  Physical Exam:   Constitutional: Patient appears well-developed HEENT:  Head: Normocephalic Eyes:EOM are normal Neck: Normal range of motion Cardiovascular: Normal rate Pulmonary/chest: Effort normal Neurologic: Patient is alert Skin: Skin is warm Psychiatric: Patient has normal mood and affect    Ortho Exam: Ortho exam demonstrates full extension and flexion to about 130 on the right elbow.  Full pronation and supination.  Does have tenderness over the common extensor mechanism as well as tenderness with resisted finger extension and wrist extension on the right-hand side.  Negative Tinel's cubital tunnel.  No real tenderness over the medial epicondyle.  Specialty Comments:  No specialty comments available.  Imaging: No results found.   PMFS History: Patient Active Problem List   Diagnosis Date Noted  . Lateral epicondylitis of right elbow 07/31/2018  . Mixed hyperlipidemia 03/01/2014  . Essential hypertension, benign 11/11/2013   Past Medical History:  Diagnosis Date  . Cervical radiculopathy   . Hypertension     Family History  Problem Relation Age of Onset  . Thyroid disease Mother     Past Surgical History:  Procedure Laterality Date  . COLONOSCOPY WITH PROPOFOL N/A 03/01/2014   Procedure: COLONOSCOPY WITH PROPOFOL;  Surgeon: Garlan Fair, MD;  Location: [facility];  Service:  Endoscopy;  Laterality: N/A;   Social History   Occupational History  . Not on file  Tobacco Use  . Smoking status: Never Smoker  . Smokeless tobacco: Never Used  Substance and Sexual Activity  . Alcohol use: Yes    Comment: rare to occ. monthly  . Drug use: No  . Sexual activity: Not on file

## 2018-12-11 ENCOUNTER — Telehealth: Payer: Self-pay | Admitting: Orthopedic Surgery

## 2018-12-11 NOTE — Telephone Encounter (Signed)
12/08/2018 ov note & 12/05/2018 MRI report faxed to [facility]. [phone]

## 2018-12-17 ENCOUNTER — Ambulatory Visit: Payer: Medicaid Other | Attending: Orthopedic Surgery | Admitting: Physical Therapy

## 2018-12-17 ENCOUNTER — Other Ambulatory Visit: Payer: Self-pay

## 2018-12-17 ENCOUNTER — Encounter: Payer: Self-pay | Admitting: Physical Therapy

## 2018-12-17 DIAGNOSIS — M25521 Pain in right elbow: Secondary | ICD-10-CM | POA: Diagnosis not present

## 2018-12-18 NOTE — Therapy (Addendum)
[place], [place], [address] Phone: [phone]   Fax:  [phone]  Physical Therapy Evaluation  Patient Details  Name: John Collins MRN: [id number] Date of Birth: 1960-07-22 Referring Provider (PT): Marlou Sa Tonna Corner, MD   Encounter Date: 12/17/2018  PT End of Session - 12/17/18 1555    Visit Number  1    Number of Visits  5    Date for PT Re-Evaluation  01/16/19    PT Start Time  3149   pt arrived late   PT Stop Time  1625    PT Time Calculation (min)  34 min    Activity Tolerance  Patient tolerated treatment well    Behavior During Therapy  [facility] for tasks assessed/performed       Past Medical History:  Diagnosis Date  . Cervical radiculopathy   . Hypertension     Past Surgical History:  Procedure Laterality Date  . COLONOSCOPY WITH PROPOFOL N/A 03/01/2014   Procedure: COLONOSCOPY WITH PROPOFOL;  Surgeon: Garlan Fair, MD;  Location: [facility];  Service: Endoscopy;  Laterality: N/A;    There were no vitals filed for this visit.   Subjective Assessment - 12/17/18 1600    Subjective  I was discharged with instructions to continue movements to heal elbow. I have not been good about doing it and after a while pain increased again. It feels like a bracelet around my elbow, lateral epicondyle pain. Leaning to pray created increased pain the other day.    Pertinent History  PMH:HTN, Hyperlipidemia, cervical radic    Diagnostic tests  XR of elbow negative, Per pt he had NCV test that showed no nerve damage.    Patient Stated Goals  decreased pain, household activities- open door, lift son, carry shopping bags, be functional without pain    Currently in Pain?  Yes    Pain Score  6     Pain Location  Elbow    Pain Orientation  Right;Lateral    Pain Descriptors / Indicators  Sore    Aggravating Factors   overuse    Pain Relieving Factors  rest         [facility] PT Assessment - 12/17/18  0001      Assessment   Medical Diagnosis  Rt tennis elbow    Referring Provider (PT)  Marlou Sa Tonna Corner, MD    Onset Date/Surgical Date  --   Feb 2019   Hand Dominance  Right    Prior Therapy  yes      Precautions   Precautions  None      Restrictions   Weight Bearing Restrictions  No      Balance Screen   Has the patient fallen in the past 6 months  No      [place]    Living Arrangements  Spouse/significant other;Other relatives    Additional Comments  stairs at home      Prior Function   Level of Independence  Independent    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      Cognition   Overall Cognitive Status  Within Functional Limits for tasks assessed      Observation/Other Assessments   Focus on Therapeutic Outcomes (FOTO)   42% limitation      Posture/Postural Control   Posture Comments  forward head and rounded shoulders      AROM   Right Elbow Flexion  --  WFL   Right Elbow Extension  0   pain     Strength   Right Elbow Flexion  5/5    Right Elbow Extension  5/5    Right Hand Grip (lbs)  55    Left Hand Grip (lbs)  70      Palpation   Palpation comment  tightness in extensor group muscle bellies; TTP lateral epicondyle                Objective measurements completed on examination: See above findings.              PT Education - 12/18/18 0713    Education Details  anatomy of condition, POC, HEP, chronic management    Person(s) Educated  Patient    Methods  Explanation    Comprehension  Verbalized understanding;Need further instruction       PT Short Term Goals - 12/18/18 0720      PT SHORT TERM GOAL #1   Title  STG=LTG        PT Long Term Goals - 12/18/18 0721      PT LONG TERM GOAL #1   Title  Grip strength within 5lb Rt to Lt    Baseline  see flowsheet    Time  4    Period  Weeks    Status  New    Target Date  01/16/19      PT LONG TERM GOAL #2    Title  FOTO to 30% limitation    Baseline  42% limited at eval    Time  4    Period  Weeks    Status  New    Target Date  01/16/19      PT LONG TERM GOAL #3   Title  Pt will be independent in long term HEP    Baseline  will review and establish as appropriate    Time  4    Period  Weeks    Status  New    Target Date  01/16/19      PT LONG TERM GOAL #4   Title  Pt will demonstrate proper ergonomics with lifting and computer work    Baseline  will educate as appropriate    Time  4    Period  Weeks    Status  New    Target Date  01/16/19             Plan - 12/17/18 1620    Clinical Impression Statement  work case, lost job, can I work? Pt presents to PT with complaints of Rt elbow pain that he has been seen for earlier this year. He admits that he was feeling better so he just stopped doing the exercises because he thought it was healed. 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 We discussed that his elbow pain is an overuse injury which is contributed to by any activity in the day that he does repetitively but I am unable to say exactly what started the injury. He asked if he can work again and we discussed that he can work but he will need to continue to manage overuse symptoms regardless of his position. We discussed the importance of continuing his HEP after this round of physical therapy. There is excessive tightness in wrist extensor  group creating pull and irritation from lateral epicondyle. Pt will benefit from PT to address pain and meet goals.    Personal Factors and Comorbidities  Time since onset of injury/illness/exacerbation    Comorbidities  cervical radiculopathy    Examination-Activity Limitations  Self Feeding;Carry;Dressing;Hygiene/Grooming;Lift    Examination-Participation Restrictions  Cleaning;Community  Activity;Yard Work;Other   work Conservation officer, historic buildings  Stable/Uncomplicated    Designer, jewellery  Low    Rehab Potential  Good    PT Frequency  1x / week    PT Duration  4 weeks    PT Treatment/Interventions  ADLs/Self Care Home Management;Cryotherapy;Electrical Stimulation;Ultrasound;Moist Heat;Iontophoresis 85m/ml Dexamethasone;Functional mobility training;Therapeutic activities;Therapeutic exercise;Neuromuscular re-education;Manual techniques;Patient/family education;Passive range of motion;Dry needling;Taping    PT Next Visit Plan  consider DN to extensor group, review previous HEP    PT Home Exercise Plan  roller, ice BID 10 min    Consulted and Agree with Plan of Care  Patient       Patient will benefit from skilled therapeutic intervention in order to improve the following deficits and impairments:  Increased muscle spasms, Decreased activity tolerance, Impaired perceived functional ability, Pain, Improper body mechanics, Decreased strength, Postural dysfunction  Visit Diagnosis: 1. Pain in right elbow        Problem List Patient Active Problem List   Diagnosis Date Noted  . Lateral epicondylitis of right elbow 07/31/2018  . Mixed hyperlipidemia 03/01/2014  . Essential hypertension, benign 11/11/2013   Alfrieda Tarry C. Kahlel Peake PT, DPT 12/18/18 7:25 AM   [facility] [place] [address]Phone: [phone]  Fax:  [phone] Name: John LallMRN: [id number] of Birth: [birth year] PHYSICAL THERAPY DISCHARGE SUMMARY  Visits from Start of Care: 1  Current functional level related to goals / functional outcomes: See above   Remaining deficits: See above   Education / Equipment: Anatomy of condition, POC, HEP, exercise form/rationale  Plan: Patient agrees to discharge.  Patient goals were not met. Patient is being discharged due to financial reasons.   ?????    Insurance terminated. John Collins C. Myliyah Rebuck PT, DPT 01/29/19 12:40 PM

## 2018-12-24 ENCOUNTER — Ambulatory Visit: Payer: Medicaid Other | Admitting: Physical Therapy

## 2018-12-31 ENCOUNTER — Encounter: Payer: Self-pay | Admitting: Physical Therapy

## 2019-01-07 ENCOUNTER — Encounter: Payer: Self-pay | Admitting: Physical Therapy

## 2019-01-14 ENCOUNTER — Encounter: Payer: Self-pay | Admitting: Physical Therapy

## 2019-03-13 DIAGNOSIS — Z23 Encounter for immunization: Secondary | ICD-10-CM | POA: Diagnosis not present

## 2019-08-01 ENCOUNTER — Other Ambulatory Visit: Payer: Self-pay | Admitting: Critical Care Medicine

## 2019-08-01 DIAGNOSIS — E782 Mixed hyperlipidemia: Secondary | ICD-10-CM

## 2019-08-03 ENCOUNTER — Other Ambulatory Visit: Payer: Self-pay | Admitting: Critical Care Medicine

## 2019-08-10 ENCOUNTER — Other Ambulatory Visit: Payer: Self-pay | Admitting: Critical Care Medicine

## 2019-08-10 DIAGNOSIS — E782 Mixed hyperlipidemia: Secondary | ICD-10-CM

## 2019-08-12 ENCOUNTER — Telehealth: Payer: Self-pay | Admitting: Family Medicine

## 2019-08-12 DIAGNOSIS — E782 Mixed hyperlipidemia: Secondary | ICD-10-CM

## 2019-08-12 NOTE — Telephone Encounter (Signed)
Patient has not been seen in >1 year

## 2019-08-12 NOTE — Telephone Encounter (Signed)
Patient called and requested for listed medications to be refilled and sent to [facility].   Lisinopril Atorvastatin

## 2019-08-13 ENCOUNTER — Other Ambulatory Visit: Payer: Self-pay | Admitting: Critical Care Medicine

## 2019-08-13 ENCOUNTER — Other Ambulatory Visit: Payer: Self-pay | Admitting: Family Medicine

## 2019-08-13 MED ORDER — AMLODIPINE BESYLATE 10 MG PO TABS: 10.0000 mg | ORAL_TABLET | Freq: Every day | ORAL | 0 refills | Status: DC

## 2019-08-13 MED ORDER — ATORVASTATIN CALCIUM 20 MG PO TABS: 20.0000 mg | ORAL_TABLET | Freq: Every day | ORAL | 0 refills | Status: DC

## 2019-08-13 NOTE — Telephone Encounter (Signed)
Patient has been set up with an appointment.

## 2019-08-13 NOTE — Telephone Encounter (Signed)
He needs an office visit.  I do not see lisinopril on his med list but amlodipine and I have refilled this along with atorvastatin.

## 2019-09-04 ENCOUNTER — Other Ambulatory Visit: Payer: Self-pay | Admitting: Family Medicine

## 2019-09-04 DIAGNOSIS — E782 Mixed hyperlipidemia: Secondary | ICD-10-CM

## 2019-09-09 ENCOUNTER — Encounter: Payer: Self-pay | Admitting: Family Medicine

## 2019-09-09 ENCOUNTER — Other Ambulatory Visit: Payer: Self-pay

## 2019-09-09 ENCOUNTER — Ambulatory Visit: Payer: Medicaid Other | Attending: Family Medicine | Admitting: Family Medicine

## 2019-09-09 VITALS — BP 119/69 | HR 70 | Wt 192.0 lb

## 2019-09-09 DIAGNOSIS — Z7901 Long term (current) use of anticoagulants: Secondary | ICD-10-CM | POA: Diagnosis not present

## 2019-09-09 DIAGNOSIS — Z79899 Other long term (current) drug therapy: Secondary | ICD-10-CM | POA: Insufficient documentation

## 2019-09-09 DIAGNOSIS — Z131 Encounter for screening for diabetes mellitus: Secondary | ICD-10-CM

## 2019-09-09 DIAGNOSIS — L309 Dermatitis, unspecified: Secondary | ICD-10-CM

## 2019-09-09 DIAGNOSIS — I10 Essential (primary) hypertension: Secondary | ICD-10-CM

## 2019-09-09 DIAGNOSIS — E782 Mixed hyperlipidemia: Secondary | ICD-10-CM | POA: Diagnosis not present

## 2019-09-09 LAB — POCT GLYCOSYLATED HEMOGLOBIN (HGB A1C): HbA1c, POC (controlled diabetic range): 5.4 % (ref 0.0–7.0)

## 2019-09-09 MED ORDER — ATORVASTATIN CALCIUM 20 MG PO TABS: 20.0000 mg | ORAL_TABLET | Freq: Every day | ORAL | 6 refills | Status: DC

## 2019-09-09 MED ORDER — AMLODIPINE BESYLATE 10 MG PO TABS: 10.0000 mg | ORAL_TABLET | Freq: Every day | ORAL | 6 refills | Status: DC

## 2019-09-09 MED ORDER — TRIAMCINOLONE ACETONIDE 0.1 % EX CREA: 1.0000 "application " | TOPICAL_CREAM | Freq: Two times a day (BID) | CUTANEOUS | 1 refills | Status: DC

## 2019-09-09 NOTE — Patient Instructions (Signed)
Contact Dermatitis °Dermatitis is redness, soreness, and swelling (inflammation) of the skin. Contact dermatitis is a reaction to something that touches the skin. °There are two types of contact dermatitis: °· Irritant contact dermatitis. This happens when something bothers (irritates) your skin, like soap. °· Allergic contact dermatitis. This is caused when you are exposed to something that you are allergic to, such as poison ivy. °What are the causes? °· Common causes of irritant contact dermatitis include: °? Makeup. °? Soaps. °? Detergents. °? Bleaches. °? Acids. °? Metals, such as nickel. °· Common causes of allergic contact dermatitis include: °? Plants. °? Chemicals. °? Jewelry. °? Latex. °? Medicines. °? Preservatives in products, such as clothing. °What increases the risk? °· Having a job that exposes you to things that bother your skin. °· Having asthma or eczema. °What are the signs or symptoms? °Symptoms may happen anywhere the irritant has touched your skin. Symptoms include: °· Dry or flaky skin. °· Redness. °· Cracks. °· Itching. °· Pain or a burning feeling. °· Blisters. °· Blood or clear fluid draining from skin cracks. °With allergic contact dermatitis, swelling may occur. This may happen in places such as the eyelids, mouth, or genitals. °How is this treated? °· This condition is treated by checking for the cause of the reaction and protecting your skin. Treatment may also include: °? Steroid creams, ointments, or medicines. °? Antibiotic medicines or other ointments, if you have a skin infection. °? Lotion or medicines to help with itching. °? A bandage (dressing). °Follow these instructions at home: °Skin care °· Moisturize your skin as needed. °· Put cool cloths on your skin. °· Put a baking soda paste on your skin. Stir water into baking soda until it looks like a paste. °· Do not scratch your skin. °· Avoid having things rub up against your skin. °· Avoid the use of soaps, perfumes, and  dyes. °Medicines °· Take or apply over-the-counter and prescription medicines only as told by your doctor. °· If you were prescribed an antibiotic medicine, take or apply it as told by your doctor. Do not stop using it even if your condition starts to get better. °Bathing °· Take a bath with: °? Epsom salts. °? Baking soda. °? Colloidal oatmeal. °· Bathe less often. °· Bathe in warm water. Avoid using hot water. °Bandage care °· If you were given a bandage, change it as told by your health care provider. °· Wash your hands with soap and water before and after you change your bandage. If soap and water are not available, use hand sanitizer. °General instructions °· Avoid the things that caused your reaction. If you do not know what caused it, keep a journal. Write down: °? What you eat. °? What skin products you use. °? What you drink. °? What you wear in the area that has symptoms. This includes jewelry. °· Check the affected areas every day for signs of infection. Check for: °? More redness, swelling, or pain. °? More fluid or blood. °? Warmth. °? Pus or a bad smell. °· Keep all follow-up visits as told by your doctor. This is important. °Contact a doctor if: °· You do not get better with treatment. °· Your condition gets worse. °· You have signs of infection, such as: °? More swelling. °? Tenderness. °? More redness. °? Soreness. °? Warmth. °· You have a fever. °· You have new symptoms. °Get help right away if: °· You have a very bad headache. °· You have neck pain. °·   Your neck is stiff. °· You throw up (vomit). °· You feel very sleepy. °· You see red streaks coming from the area. °· Your bone or joint near the area hurts after the skin has healed. °· The area turns darker. °· You have trouble breathing. °Summary °· Dermatitis is redness, soreness, and swelling of the skin. °· Symptoms may occur where the irritant has touched you. °· Treatment may include medicines and skin care. °· If you do not know what caused  your reaction, keep a journal. °· Contact a doctor if your condition gets worse or you have signs of infection. °This information is not intended to replace advice given to you by your health care provider. Make sure you discuss any questions you have with your health care provider. °Document Revised: 09/10/2018 Document Reviewed: 12/04/2017 °Elsevier Patient Education © 2020 [employer] ° °

## 2019-09-09 NOTE — Progress Notes (Signed)
Established Patient Office Visit  Subjective:  Patient ID: John Collins, male    DOB: 1960/06/30  Age: 59 y.o. MRN: [id number]   History was taken by medical student, and audio recorded for educational purposes. Patient consented to audio recording.  CC:  Chief Complaint  Patient presents with  . Hypertension    HPI Walther Sizemore presents for follow up care for Hypertension and Hyperlipidemia. He has not been seen since July of 2020 due to work. He does not express any concerns for today's visit. He needs refills for his anti-hypertensive and cholesterol medications. He is complaint with his medication and denies any difficulties accessing or taking them. He has been trying to work on his diet and cut out more starchy foods as he states they give him gas. He is not active, but does morning stretches. He does not report any headaches, chest pain, SOB, dyspnea on exertion.  Past Medical History:  Diagnosis Date  . Cervical radiculopathy   . Hypertension     Past Surgical History:  Procedure Laterality Date  . COLONOSCOPY WITH PROPOFOL N/A 03/01/2014   Procedure: COLONOSCOPY WITH PROPOFOL;  Surgeon: Garlan Fair, MD;  Location: [facility];  Service: Endoscopy;  Laterality: N/A;    Family History  Problem Relation Age of Onset  . Thyroid disease Mother     Social History   Socioeconomic History  . Marital status: Married    Spouse name: Not on file  . Number of children: Not on file  . Years of education: Not on file  . Highest education level: Not on file  Occupational History  . Not on file  Tobacco Use  . Smoking status: Never Smoker  . Smokeless tobacco: Never Used  Substance and Sexual Activity  . Alcohol use: Yes    Comment: rare to occ. monthly  . Drug use: No  . Sexual activity: Not on file  Other Topics Concern  . Not on file  Social History Narrative   He lives with 2 daughter, native of [place], immigrate to [place] 13 years ago. 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   Social Determinants of Health   Financial Resource Strain:   . Difficulty of Paying Living Expenses:   Food Insecurity:   . Worried About Charity fundraiser in the Last Year:   . Arboriculturist in the Last Year:   Transportation Needs:   . Film/video editor (Medical):   Marland Kitchen Lack of Transportation (Non-Medical):   Physical Activity:   . Days of Exercise per Week: None  . Minutes of Exercise per Session:   Stress:   . Feeling of Stress :   Social Connections:   . Frequency of Communication with Friends and Family:   . Frequency of Social Gatherings with Friends and Family:   . Attends Religious Services:   . Active Member of Clubs or Organizations:   . Attends Archivist Meetings:   Marland Kitchen Marital Status:   Intimate Partner Violence:   . Fear of Current or Ex-Partner:   . Emotionally Abused:   Marland Kitchen Physically Abused:   . Sexually Abused:     Outpatient Medications Prior to Visit  Medication Sig Dispense Refill  . amLODipine (NORVASC) 10 MG tablet Take 1 tablet (10 mg total) by mouth daily. 30 tablet 0  . atorvastatin (LIPITOR) 20 MG tablet Take 1 tablet (20 mg total) by mouth daily. 30 tablet 0  . cyclobenzaprine (FLEXERIL) 10 MG tablet  Take 1 tablet (10 mg total) by mouth at bedtime. (Patient not taking: Reported on 12/17/2018) 30 tablet 1  . ibuprofen (ADVIL,MOTRIN) 600 MG tablet Take 1 tablet (600 mg total) by mouth every 8 (eight) hours as needed. (Patient not taking: Reported on 12/17/2018) 30 tablet 0  . nabumetone (RELAFEN) 500 MG tablet 1 po daily x 30 days-d/c other NSAIDS (Patient not taking: Reported on 12/17/2018) 60 tablet 0  . predniSONE (STERAPRED UNI-PAK 21 TAB) 5 MG (21) TBPK tablet Take dosepak as directed (Patient not taking: Reported on 08/20/2018) 21 tablet 0  . traMADol (ULTRAM) 50 MG tablet 1 po q hs prn pain (Patient not taking: Reported on 12/17/2018) 30 tablet 0   No facility-administered medications prior to visit.      Allergies  Allergen Reactions  . Lisinopril     Cough     ROS Review of Systems  Constitutional: Negative for activity change, appetite change, chills, fever and unexpected weight change.  HENT: Negative for congestion, hearing loss, sore throat and trouble swallowing.   Eyes: Negative.  Negative for photophobia.  Respiratory: Negative for cough and shortness of breath.   Cardiovascular: Negative for chest pain, palpitations and leg swelling.  Gastrointestinal: Negative for abdominal pain, constipation, diarrhea, nausea and vomiting.  Endocrine: Negative.   Genitourinary: Negative.   Musculoskeletal: Negative for arthralgias and joint swelling.  Allergic/Immunologic: Negative.   Neurological: Negative for weakness, numbness and headaches.  Psychiatric/Behavioral: Negative for agitation, dysphoric mood, sleep disturbance and suicidal ideas.        Objective:    Physical Exam  Constitutional: He is oriented to person, place, and time. He appears well-developed.  HENT:  Head: Normocephalic and atraumatic.  Eyes: Pupils are equal, round, and reactive to light. Conjunctivae and EOM are normal. No scleral icterus.  Cardiovascular: Normal rate, regular rhythm, normal heart sounds and intact distal pulses.  No murmur heard. Pulmonary/Chest: Effort normal and breath sounds normal.  Abdominal: Soft. Bowel sounds are normal. He exhibits no distension. There is no abdominal tenderness.  Musculoskeletal:        General: No edema. Normal range of motion.  Neurological: He is alert and oriented to person, place, and time.  Psychiatric: He has a normal mood and affect.    BP 119/69   Pulse 70   Wt 192 lb (87.1 kg)   SpO2 98%   BMI 28.35 kg/m  Wt Readings from Last 3 Encounters:  09/09/19 192 lb (87.1 kg)  08/20/18 204 lb (92.5 kg)  07/31/18 206 lb 12.8 oz (93.8 kg)     There are no preventive care reminders to display for this patient.  There are no preventive care  reminders to display for this patient.  Lab Results  Component Value Date   TSH 0.574 11/11/2013   Lab Results  Component Value Date   WBC 5.8 11/11/2013   HGB 12.7 (L) 11/11/2013   HCT 37.6 (L) 11/11/2013   MCV 85.3 11/11/2013   PLT 256 11/11/2013   Lab Results  Component Value Date   NA 142 12/17/2017   K 4.3 12/17/2017   CO2 21 12/17/2017   GLUCOSE 110 (H) 12/17/2017   BUN 8 12/17/2017   CREATININE 0.78 12/17/2017   BILITOT 0.5 12/17/2017   ALKPHOS 91 12/17/2017   AST 21 12/17/2017   ALT 20 12/17/2017   PROT 7.1 12/17/2017   ALBUMIN 4.4 12/17/2017   CALCIUM 9.1 12/17/2017   Lab Results  Component Value Date   CHOL 177 12/17/2017  Lab Results  Component Value Date   HDL 55 12/17/2017   Lab Results  Component Value Date   LDLCALC 103 (H) 12/17/2017   Lab Results  Component Value Date   TRIG 97 12/17/2017   Lab Results  Component Value Date   CHOLHDL 3.2 12/17/2017   Lab Results  Component Value Date   HGBA1C 5.4 09/09/2019      Assessment & Plan:   Problem List Items Addressed This Visit      Cardiovascular and Mediastinum   Essential hypertension, benign Controlled with medication. BP on today's visit was 119/69 Refill and Continue on Amplodipine 66m We will check CMP labs today. Encourage patient on eating a healthy diet like DASH and 150 min of exercise per week.    Relevant Medications   amLODipine (NORVASC) 10 MG tablet   atorvastatin (LIPITOR) 20 MG tablet     Other   Mixed hyperlipidemia Controlled on medication. Continue on Atorvastatin 242m We will recheck Lipid Panel today (last lipid panel was 12/18/2017   Relevant Medications   amLODipine (NORVASC) 10 MG tablet   atorvastatin (LIPITOR) 20 MG tablet   Other Relevant Orders   CMP14+EGFR   Lipid panel    Other Visit Diagnoses    Dermatitis    -  Primary   Relevant Medications   triamcinolone cream (KENALOG) 0.1 %   Screening for diabetes mellitus       Relevant Orders    POCT glycosylated hemoglobin (Hb A1C) (Completed)      Meds ordered this encounter  Medications  . amLODipine (NORVASC) 10 MG tablet    Sig: Take 1 tablet (10 mg total) by mouth daily.    Dispense:  30 tablet    Refill:  6    Normal  . atorvastatin (LIPITOR) 20 MG tablet    Sig: Take 1 tablet (20 mg total) by mouth daily.    Dispense:  30 tablet    Refill:  6  . triamcinolone cream (KENALOG) 0.1 %    Sig: Apply 1 application topically 2 (two) times daily.    Dispense:  30 g    Refill:  1    Follow-up: Return in about 6 months (around 03/10/2020) for medical conditions.    ArTheodis SatoMedical Student

## 2019-09-10 LAB — CMP14+EGFR
ALT: 20 IU/L (ref 0–44)
AST: 19 IU/L (ref 0–40)
Albumin/Globulin Ratio: 1.8 (ref 1.2–2.2)
Albumin: 4.7 g/dL (ref 3.8–4.9)
Alkaline Phosphatase: 80 IU/L (ref 39–117)
BUN/Creatinine Ratio: 14 (ref 9–20)
BUN: 13 mg/dL (ref 6–24)
Bilirubin Total: 0.5 mg/dL (ref 0.0–1.2)
CO2: 25 mmol/L (ref 20–29)
Calcium: 9.5 mg/dL (ref 8.7–10.2)
Chloride: 106 mmol/L (ref 96–106)
Creatinine, Ser: 0.94 mg/dL (ref 0.76–1.27)
GFR calc Af Amer: 103 mL/min/{1.73_m2} (ref >59)
GFR calc non Af Amer: 89 mL/min/{1.73_m2} (ref >59)
Globulin, Total: 2.6 g/dL (ref 1.5–4.5)
Glucose: 102 mg/dL — ABNORMAL HIGH (ref 65–99)
Potassium: 4.1 mmol/L (ref 3.5–5.2)
Sodium: 143 mmol/L (ref 134–144)
Total Protein: 7.3 g/dL (ref 6.0–8.5)

## 2019-09-10 LAB — LIPID PANEL
Chol/HDL Ratio: 3.5 ratio (ref 0.0–5.0)
Cholesterol, Total: 163 mg/dL (ref 100–199)
HDL: 47 mg/dL (ref >39)
LDL Chol Calc (NIH): 104 mg/dL — ABNORMAL HIGH (ref 0–99)
Triglycerides: 63 mg/dL (ref 0–149)
VLDL Cholesterol Cal: 12 mg/dL (ref 5–40)

## 2019-12-15 ENCOUNTER — Ambulatory Visit: Payer: Medicaid Other | Admitting: Family Medicine

## 2020-01-17 ENCOUNTER — Other Ambulatory Visit: Payer: Self-pay | Admitting: Critical Care Medicine

## 2020-01-18 ENCOUNTER — Ambulatory Visit: Payer: Medicaid Other | Attending: Family Medicine | Admitting: Family Medicine

## 2020-01-18 ENCOUNTER — Other Ambulatory Visit: Payer: Self-pay

## 2020-01-18 DIAGNOSIS — I10 Essential (primary) hypertension: Secondary | ICD-10-CM

## 2020-01-18 DIAGNOSIS — E782 Mixed hyperlipidemia: Secondary | ICD-10-CM | POA: Diagnosis not present

## 2020-01-18 DIAGNOSIS — R29818 Other symptoms and signs involving the nervous system: Secondary | ICD-10-CM

## 2020-01-18 MED ORDER — AMLODIPINE BESYLATE 10 MG PO TABS: 10.0000 mg | ORAL_TABLET | Freq: Every day | ORAL | 6 refills | Status: DC

## 2020-01-18 MED ORDER — ATORVASTATIN CALCIUM 20 MG PO TABS: 20.0000 mg | ORAL_TABLET | Freq: Every day | ORAL | 6 refills | Status: DC

## 2020-01-18 NOTE — Progress Notes (Signed)
Virtual Visit via Telephone Note  I connected with [employer], on 01/18/2020 at 4:07 PM by telephone due to the COVID-19 pandemic and verified that I am speaking with the correct person using two identifiers.   Consent: I discussed the limitations, risks, security and privacy concerns of performing an evaluation and management service by telephone and the availability of in person appointments. I also discussed with the patient that there may be a patient responsible charge related to this service. The patient expressed understanding and agreed to proceed.   Location of Patient: Home  Location of Provider: Clinic   Persons participating in Telemedicine visit: Nicodemus Zogg Ozzie Hoyle Dr. Margarita Rana     History of Present Illness: John Collins is a 59 year old male with a history of hypertension, hyperlipidemia seen for an acute visit.  He has had headaches over the last 2 months described as mild and he would wake up with them but they have since resolved and no longer occur. Previously occurred in his occipital region but he denies presence of blurry vision, nausea or vomiting. He snores at night but denies daytime somnolence. Denies presence of sinus symptoms.  Past Medical History:  Diagnosis Date  . Cervical radiculopathy   . Hypertension    Allergies  Allergen Reactions  . Lisinopril     Cough     Current Outpatient Medications on File Prior to Visit  Medication Sig Dispense Refill  . amLODipine (NORVASC) 10 MG tablet Take 1 tablet (10 mg total) by mouth daily. 30 tablet 6  . atorvastatin (LIPITOR) 20 MG tablet Take 1 tablet (20 mg total) by mouth daily. 30 tablet 6  . triamcinolone cream (KENALOG) 0.1 % Apply 1 application topically 2 (two) times daily. (Patient not taking: Reported on 01/18/2020) 30 g 1   No current facility-administered medications on file prior to visit.    Observations/Objective: Awake, alert, oriented x3 Not in acute  distress  Assessment and Plan: 1. Suspected sleep apnea His headaches could be a symptom of sleep apnea Low suspicion for chronic sinusitis as he has no sinus symptoms - Split night study; Future  2. Essential hypertension, benign Stable Counseled on blood pressure goal of less than 130/80, low-sodium, DASH diet, medication compliance, 150 minutes of moderate intensity exercise per week. Discussed medication compliance, adverse effects. - amLODipine (NORVASC) 10 MG tablet; Take 1 tablet (10 mg total) by mouth daily.  Dispense: 30 tablet; Refill: 6  3. Mixed hyperlipidemia Controlled Low-cholesterol diet - atorvastatin (LIPITOR) 20 MG tablet; Take 1 tablet (20 mg total) by mouth daily.  Dispense: 30 tablet; Refill: 6   Follow Up Instructions: 3 months for chronic disease management   I discussed the assessment and treatment plan with the patient. The patient was provided an opportunity to ask questions and all were answered. The patient agreed with the plan and demonstrated an understanding of the instructions.   The patient was advised to call back or seek an in-person evaluation if the symptoms worsen or if the condition fails to improve as anticipated.     I provided 11 minutes total of non-face-to-face time during this encounter including median intraservice time, reviewing previous notes, investigations, ordering medications, medical decision making, coordinating care and patient verbalized understanding at the end of the visit.     Charlott Rakes, MD, FAAFP. [facility] and [facility], [place]   01/18/2020, 4:07 PM

## 2020-01-18 NOTE — Progress Notes (Signed)
Needs refills on medications.  States that headaches has gotten better.

## 2020-01-20 ENCOUNTER — Other Ambulatory Visit: Payer: Self-pay | Admitting: Critical Care Medicine

## 2020-02-24 IMAGING — MR MRI OF THE RIGHT ELBOW WITHOUT CONTRAST
4 of 5 series · 19 of 40 positions shown · non-contrast
Comparison: Radiographs 07/27/2018

CLINICAL DATA: Right lateral elbow pain.

EXAM:
MRI OF THE RIGHT ELBOW WITHOUT CONTRAST
TECHNIQUE: Multiplanar, multisequence MR imaging of the elbow was performed. No
intravenous contrast was administered.

[Series 3: T1 · axial · 3.5mm · 0.27mm/px · z∈[-31,+39]mm · 3 of 23 slices shown]
[im 4/23]
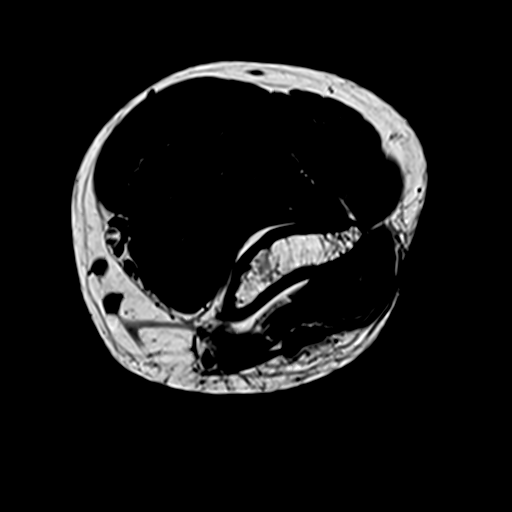
[im 13/23]
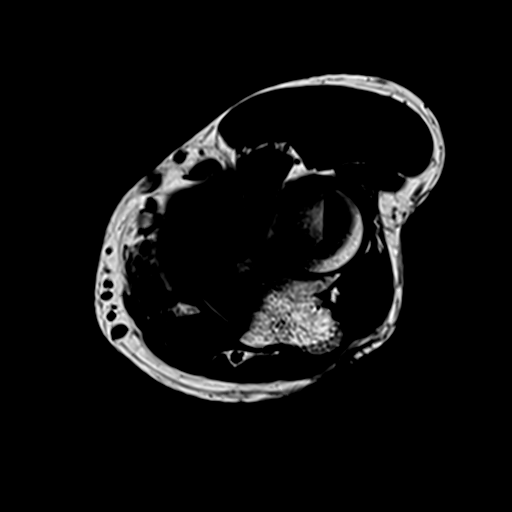
[im 19/23]
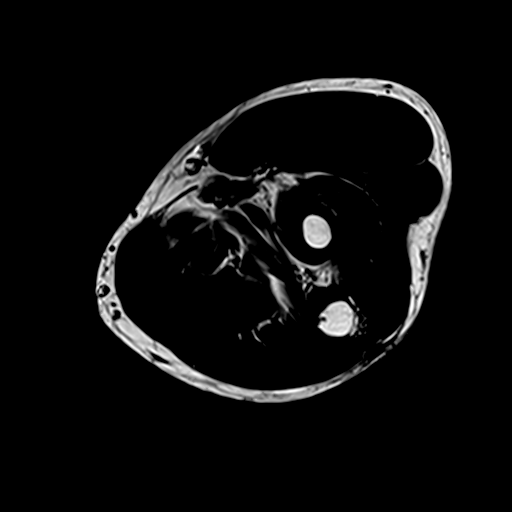

[Series 5: T2 fat-sat · axial · 3.5mm · 0.27mm/px · z∈[-45,+39]mm · 5 of 23 slices shown (1 of 2)]
[im 1/23]
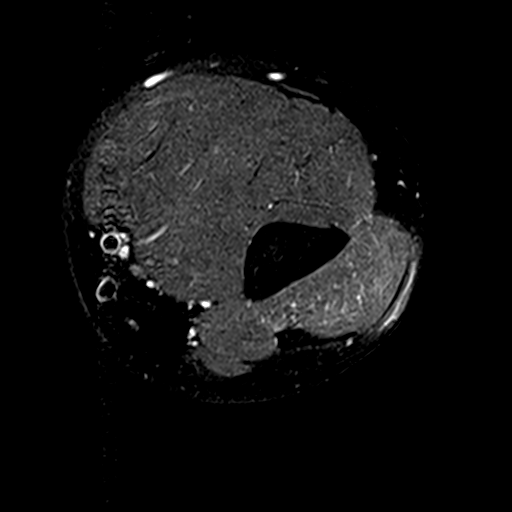
[im 4/23]
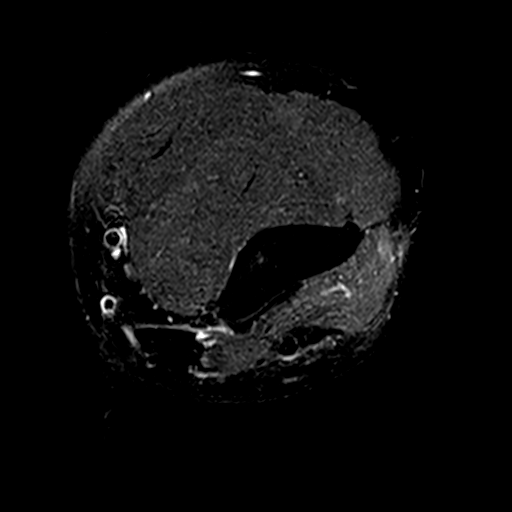
[im 7/23]
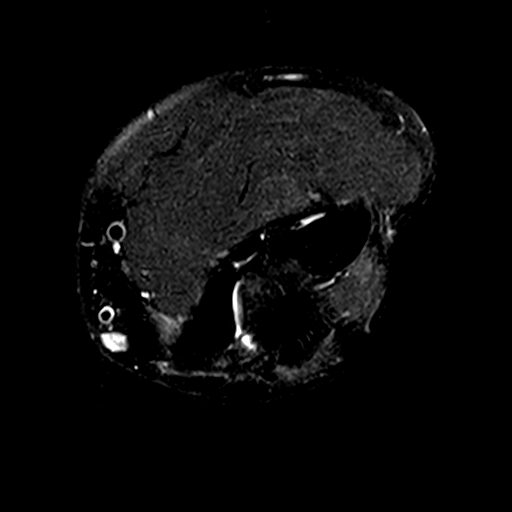
[im 13/23]
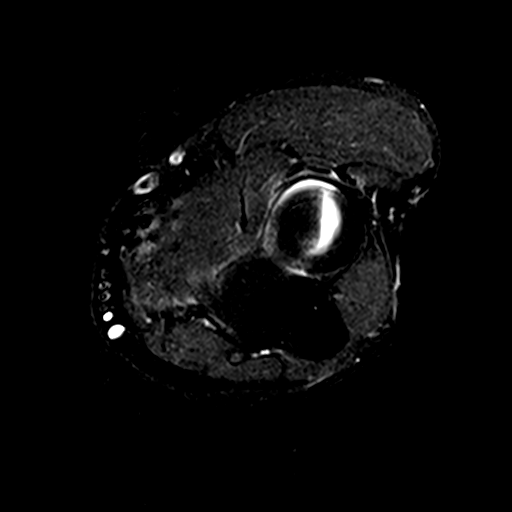
[im 19/23]
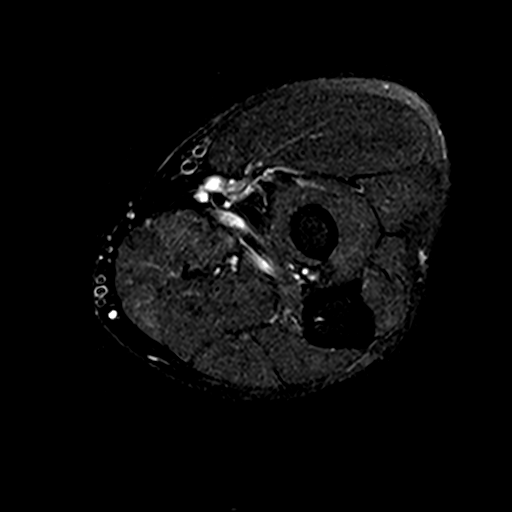

[Series 6: T2 fat-sat · oblique · 3.5mm · 0.27mm/px · 3 of 22 slices shown (2 of 2)]
[im 4/22]
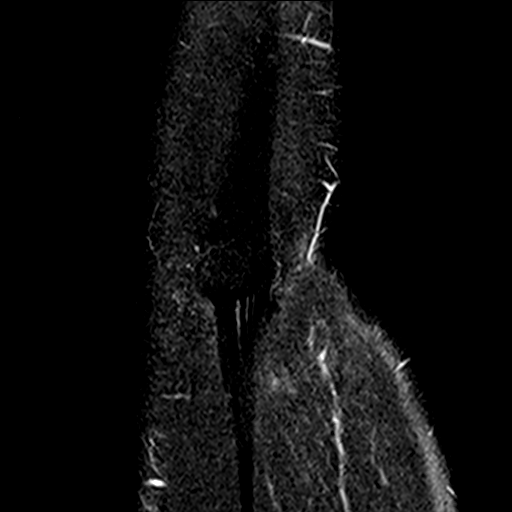
[im 13/22]
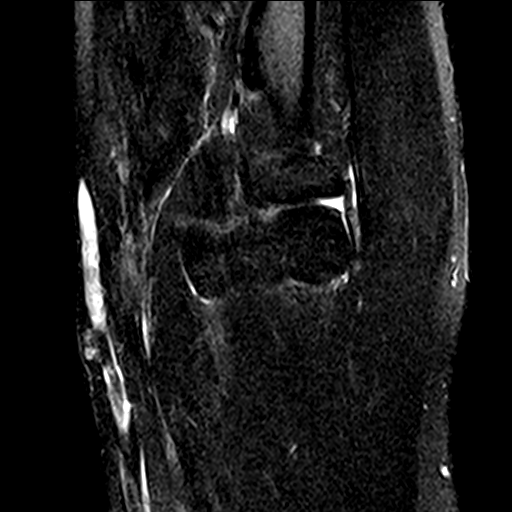
[im 19/22]
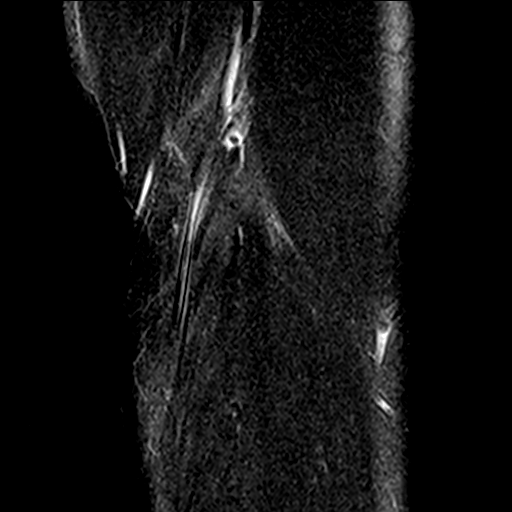

[Series 7: PD fat-sat · sagittal · 3.0mm · 0.27mm/px · 8 of 22 slices shown]
[im 1/22]
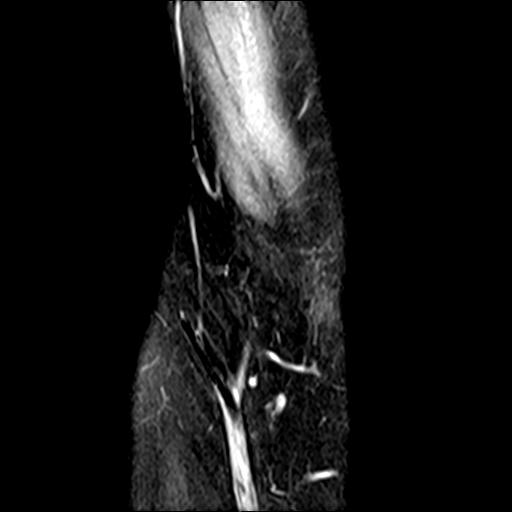
[im 4/22]
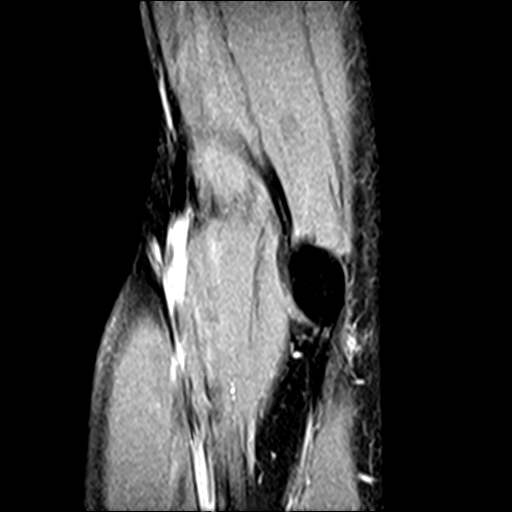
[im 7/22]
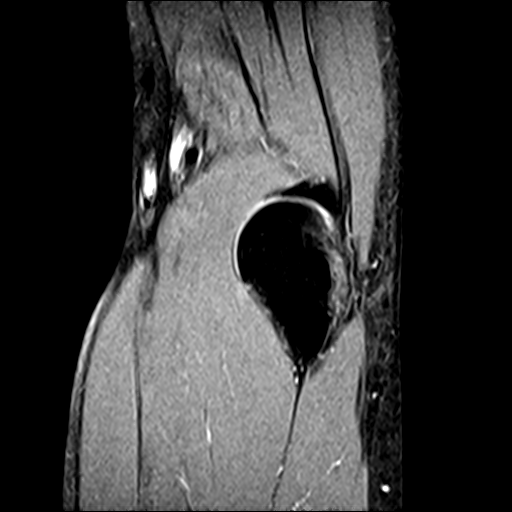
[im 10/22]
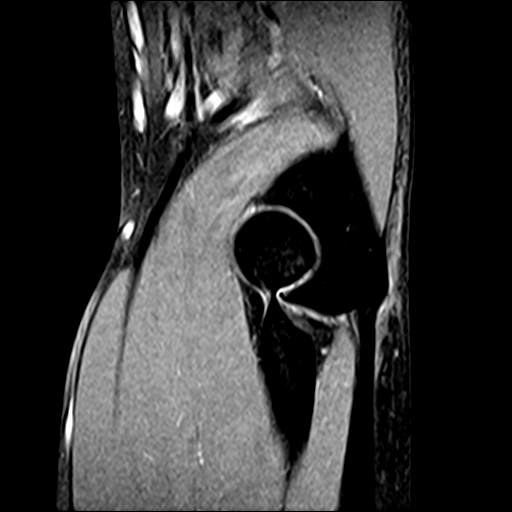
[im 13/22]
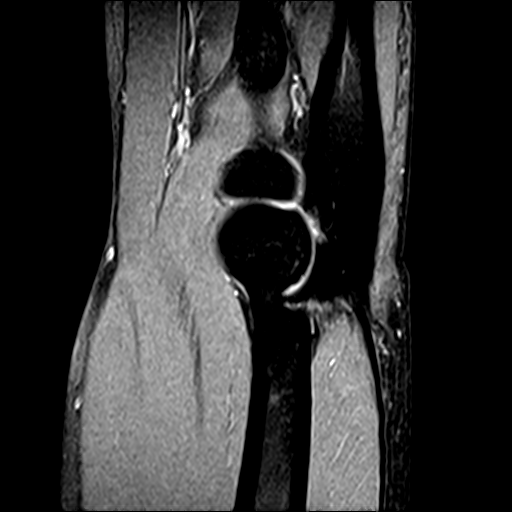
[im 16/22]
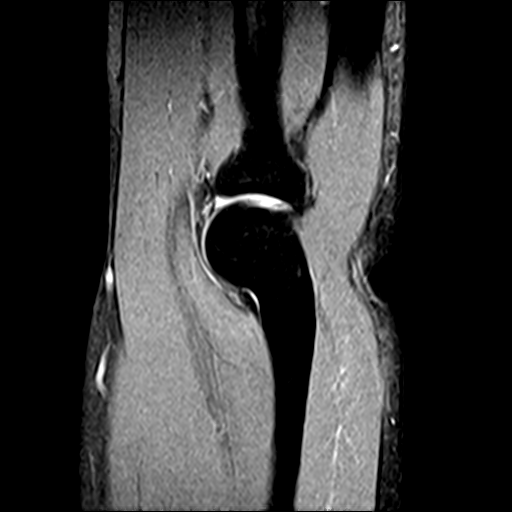
[im 19/22]
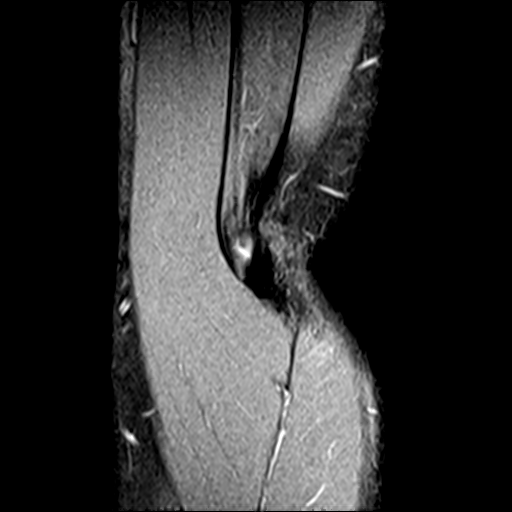
[im 22/22]
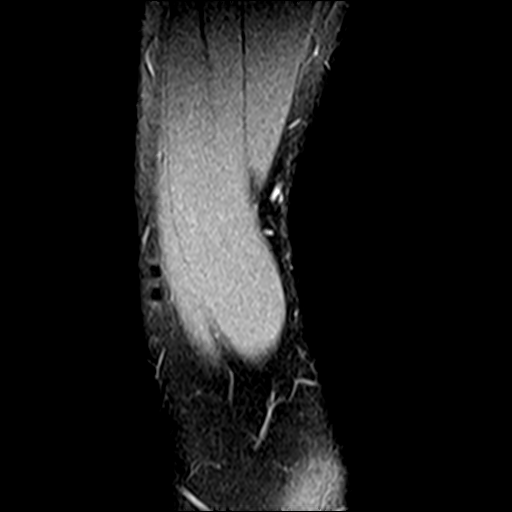

[19 of 40 positions shown; findings below may reference images not displayed]

FINDINGS: TENDONS

Common forearm flexor origin: Normal

Common forearm extensor origin: Moderate tendinopathy with
interstitial tears. No full-thickness retracted tear.

Biceps: Intact.  The brachialis tendon is also intact.

Triceps: Normal.

LIGAMENTS

Medial stabilizers: Intact

Lateral stabilizers:  Intact

Cartilage: Moderate elbow joint degenerative changes for age with
degenerative chondrosis and early spurring changes. No
full-thickness cartilage defect or osteochondral lesion.

Joint: No joint effusion or loose bodies.

Cubital tunnel: Normal

Bones: No acute bony findings.  No bone contusions or bone lesions.
IMPRESSION: 1. Moderate common extensor tendinopathy with interstitial tears.
2. Intact radial and ulnar collateral ligaments.
3. Moderate elbow joint degenerative changes for age.
4. No acute bony findings.

## 2020-06-17 ENCOUNTER — Other Ambulatory Visit: Payer: Self-pay

## 2020-06-17 ENCOUNTER — Ambulatory Visit (HOSPITAL_BASED_OUTPATIENT_CLINIC_OR_DEPARTMENT_OTHER): Payer: Medicaid Other | Attending: Family Medicine | Admitting: Internal Medicine

## 2020-06-17 VITALS — Ht 68.0 in | Wt 195.0 lb

## 2020-06-17 DIAGNOSIS — G4733 Obstructive sleep apnea (adult) (pediatric): Secondary | ICD-10-CM | POA: Diagnosis not present

## 2020-06-21 ENCOUNTER — Other Ambulatory Visit (HOSPITAL_BASED_OUTPATIENT_CLINIC_OR_DEPARTMENT_OTHER): Payer: Self-pay

## 2020-06-21 DIAGNOSIS — R29818 Other symptoms and signs involving the nervous system: Secondary | ICD-10-CM

## 2020-07-03 DIAGNOSIS — G4733 Obstructive sleep apnea (adult) (pediatric): Secondary | ICD-10-CM | POA: Diagnosis not present

## 2020-07-03 NOTE — Procedures (Signed)
Patient Name: John Collins, John Collins Date: 06/17/2020 Gender: Male D.O.B: 1961/02/14 Age (years): 6 Referring Provider: Arnoldo Morale Height (inches): 58 Interpreting Physician: Baird Lyons MD, ABSM Weight (lbs): 195 RPSGT: Earney Hamburg BMI: 41 MRN: [id number] Neck Size: 16.00  CLINICAL INFORMATION Sleep Study Type: Split Night CPAP Indication for sleep study: Snoring Epworth Sleepiness Score: 8  SLEEP STUDY TECHNIQUE As per the AASM Manual for the Scoring of Sleep and Associated Events v2.3 (April 2016) with a hypopnea requiring 4% desaturations.  The channels recorded and monitored were frontal, central and occipital EEG, electrooculogram (EOG), submentalis EMG (chin), nasal and oral airflow, thoracic and abdominal wall motion, anterior tibialis EMG, snore microphone, electrocardiogram, and pulse oximetry. Continuous positive airway pressure (CPAP) was initiated when the patient met split night criteria and was titrated according to treat sleep-disordered breathing.  MEDICATIONS Medications self-administered by patient taken the night of the study : none reported  RESPIRATORY PARAMETERS Diagnostic  Total AHI (/hr): 22.9 RDI (/hr): 27.7 OA Index (/hr): 3.3 CA Index (/hr): 0.0 REM AHI (/hr): 37.3 NREM AHI (/hr): 19.8 Supine AHI (/hr): 25.2 Non-supine AHI (/hr): 19.2 Min O2 Sat (%): 89.0 Mean O2 (%): 95.5 Time below 88% (min): 0   Titration  Optimal Pressure (cm): 11 AHI at Optimal Pressure (/hr): 0.0 Min O2 at Optimal Pressure (%): 96.0 Supine % at Optimal (%): 100 Sleep % at Optimal (%): 18   SLEEP ARCHITECTURE The recording time for the entire night was 362.9 minutes.  During a baseline period of 153.0 minutes, the patient slept for 125.5 minutes in REM and nonREM, yielding a sleep efficiency of 82.0%%. Sleep onset after lights out was 7.3 minutes with a REM latency of 113.5 minutes. The patient spent 3.2%% of the night in stage N1 sleep, 78.9%% in stage N2  sleep, 0.0%% in stage N3 and 17.9% in REM.  During the titration period of 207.0 minutes, the patient slept for 107.1 minutes in REM and nonREM, yielding a sleep efficiency of 51.7%%. Sleep onset after CPAP initiation was 12.9 minutes with a REM latency of 43.5 minutes. The patient spent 2.8%% of the night in stage N1 sleep, 59.8%% in stage N2 sleep, 0.0%% in stage N3 and 37.4% in REM.  CARDIAC DATA The 2 lead EKG demonstrated sinus rhythm. The mean heart rate was 55.7 beats per minute. Other EKG findings include: None.  LEG MOVEMENT DATA The total Periodic Limb Movements of Sleep (PLMS) were 0. The PLMS index was 0.0 .  IMPRESSIONS - Moderate obstructive sleep apnea occurred during the diagnostic portion of the study(AHI = 22.9/hour). An optimal PAP pressure was selected for this patient ( 11 cm of water) - No significant central sleep apnea occurred during the diagnostic portion of the study (CAI = 0.0/hour). - The patient had minimal or no oxygen desaturation during the diagnostic portion of the study (Min O2 = 89.0%). Minimum O2 sat on CPAP 11 was 96%. - The patient snored with loud snoring volume during the diagnostic portion of the study. - No cardiac abnormalities were noted during this study. - Clinically significant periodic limb movements did not occur during sleep.  DIAGNOSIS - Obstructive Sleep Apnea (G47.33)  RECOMMENDATIONS - Trial of CPAP therapy on 11 cm H2O or autopap. - Patient used a Small size Fisher[employer]Paykel Full Face Mask Simplus mask and heated humidification. - Be careful with alcohol, sedatives and other CNS depressants that may worsen sleep apnea and disrupt normal sleep architecture. - Sleep hygiene should be reviewed to assess  factors that may improve sleep quality. - Weight management and regular exercise should be initiated or continued.  [Electronically signed] 07/03/2020 12:16 PM  Clinton Young MD, ABSM Diplomate, American Board of Sleep Medicine   NPI:  [id number]                        Clinton Young Diplomate, American Board of Sleep Medicine  ELECTRONICALLY SIGNED ON:  07/03/2020, 12:13 PM [facility] PH: [phone]   FX: [phone] ACCREDITED BY THE AMERICAN ACADEMY OF SLEEP MEDICINE 

## 2020-07-04 ENCOUNTER — Other Ambulatory Visit: Payer: Self-pay | Admitting: Family Medicine

## 2020-07-04 MED ORDER — MISC. DEVICES MISC: 0 refills | Status: DC

## 2020-07-05 ENCOUNTER — Telehealth: Payer: Self-pay

## 2020-07-05 DIAGNOSIS — E782 Mixed hyperlipidemia: Secondary | ICD-10-CM

## 2020-07-05 DIAGNOSIS — I10 Essential (primary) hypertension: Secondary | ICD-10-CM

## 2020-07-05 NOTE — Telephone Encounter (Signed)
Call placed to patient and explained results of sleep study and recommendations including use of CPAP. He said that he understood and has no preference for DME company.  Explained to him that the order will be sent to [facility]. They will verify insurance coverage and contact him about any out of pocket costs as well as schedule pick up of tthe machine. Informed him that the DME companies are currently experiencing back logs with CPAP orders due to supply chain delays so it may be a few weeks before he is able to receive a machine.  Order then sent to [facility]   He wanted Dr Margarita Rana to know that he needs refills of atorvastatin and amlodipine sent to [facility]

## 2020-07-06 MED ORDER — AMLODIPINE BESYLATE 10 MG PO TABS: 10.0000 mg | ORAL_TABLET | Freq: Every day | ORAL | 6 refills | Status: DC

## 2020-07-06 MED ORDER — ATORVASTATIN CALCIUM 20 MG PO TABS
20.0000 mg | ORAL_TABLET | Freq: Every day | ORAL | 6 refills | Status: DC
Start: 2020-07-06 — End: 2021-06-21

## 2020-07-06 NOTE — Telephone Encounter (Signed)
Refills have been sent to pharmacy °

## 2020-07-06 NOTE — Telephone Encounter (Signed)
Call placed to patient and informed him that Dr Margarita Rana has sent refills to his pharmacy for the medications he requested. He was very Patent attorney.

## 2020-07-08 ENCOUNTER — Telehealth: Payer: Self-pay

## 2020-07-08 NOTE — Telephone Encounter (Signed)
Call placed to [facility], spoke to [facility] who stated that they received the order for the CPAP and it is currently being reviewed.

## 2020-09-28 ENCOUNTER — Telehealth: Payer: Self-pay

## 2020-09-28 NOTE — Telephone Encounter (Signed)
Call placed to [facility], spoke to Gentry regarding the status of the CPAP order. She confirmed that they have the order and have assigned it to an Respiratory Therapist to schedule delivery.

## 2021-03-25 ENCOUNTER — Ambulatory Visit (HOSPITAL_COMMUNITY)
Admission: EM | Admit: 2021-03-25 | Discharge: 2021-03-25 | Disposition: A | Discharge status: Home / Self Care | Payer: Medicaid Other

## 2021-03-25 ENCOUNTER — Other Ambulatory Visit: Payer: Self-pay

## 2021-03-25 ENCOUNTER — Encounter (HOSPITAL_COMMUNITY): Payer: Self-pay | Admitting: Emergency Medicine

## 2021-03-25 DIAGNOSIS — J0140 Acute pansinusitis, unspecified: Secondary | ICD-10-CM | POA: Diagnosis not present

## 2021-03-25 DIAGNOSIS — R0982 Postnasal drip: Secondary | ICD-10-CM

## 2021-03-25 MED ORDER — FLUTICASONE PROPIONATE 50 MCG/ACT NA SUSP: 1.0000 | Freq: Every day | NASAL | 0 refills | Status: AC

## 2021-03-25 MED ORDER — AMOXICILLIN-POT CLAVULANATE 875-125 MG PO TABS: 1.0000 | ORAL_TABLET | Freq: Two times a day (BID) | ORAL | 0 refills | Status: DC

## 2021-03-25 NOTE — Discharge Instructions (Signed)
I believe you have a sinus infection.  Please start Augmentin twice daily for 7 days.  Use Flonase 1 spray in each nostril twice daily for 1 week then decrease to maintenance dose of 1 spray in each nostril thereafter.  I believe that allergies are contributing to your symptoms but you may benefit from an antihistamine such as Claritin or Zyrtec.  If your symptoms are not improving please follow-up with your primary care provider.  If at any point have worsening symptoms including high fever, severe cough, shortness of breath, chest discomfort, nausea/vomiting you need to go to the emergency room.

## 2021-03-25 NOTE — ED Provider Notes (Signed)
[place]    CSN: [id number] Arrival date & time: 03/25/21  1213      History   Chief Complaint Chief Complaint  Patient presents with   Sore Throat    HPI John Collins is a 60 y.o. male.   Patient presents today with a 2-week history of URI symptoms.  Reports sore throat, cough, nasal congestion, postnasal drainage.  Denies fever, significant cough, shortness of breath, chest pain, nausea, vomiting.  He has tried over-the-counter medications without improvement of symptoms.  Denies any known sick contacts.  He denies any history of allergies or asthma.  He denies any chronic pulmonary disease or smoking.  He denies any recent antibiotic use.  Denies history of recurrent sinus infections or having to see an ENT in the past.   Past Medical History:  Diagnosis Date   Cervical radiculopathy    Hypertension     Patient Active Problem List   Diagnosis Date Noted   Lateral epicondylitis of right elbow 07/31/2018   Mixed hyperlipidemia 03/01/2014   Essential hypertension, benign 11/11/2013    Past Surgical History:  Procedure Laterality Date   COLONOSCOPY WITH PROPOFOL N/A 03/01/2014   Procedure: COLONOSCOPY WITH PROPOFOL;  Surgeon: Garlan Fair, MD;  Location: [facility];  Service: Endoscopy;  Laterality: N/A;       Home Medications    Prior to Admission medications   Medication Sig Start Date End Date Taking? Authorizing Provider  amoxicillin-clavulanate (AUGMENTIN) 875-125 MG tablet Take 1 tablet by mouth every 12 (twelve) hours. 03/25/21  Yes Shanikqua Zarzycki K, PA-C  fluticasone (FLONASE) 50 MCG/ACT nasal spray Place 1 spray into both nostrils daily. 03/25/21  Yes Miliana Gangwer K, PA-C  naproxen (NAPROSYN) 375 MG tablet Take 375 mg by mouth 2 (two) times daily with a meal.   Yes [provider]  amLODipine (NORVASC) 10 MG tablet Take 1 tablet (10 mg total) by mouth daily. 07/06/20   Charlott Rakes, MD  atorvastatin (LIPITOR) 20 MG tablet Take  1 tablet (20 mg total) by mouth daily. 07/06/20   Charlott Rakes, MD  Misc. Devices MISC CPAP therapy on 11 cm H2O or autopap. Small size Fisher[employer]Paykel Full Face Mask Simplus mask and heated humidification.  Diagnosis obstructive sleep apnea 07/04/20   Charlott Rakes, MD  triamcinolone cream (KENALOG) 0.1 % Apply 1 application topically 2 (two) times daily. Patient not taking: Reported on 01/18/2020 09/09/19   Charlott Rakes, MD    Family History Family History  Problem Relation Age of Onset   Thyroid disease Mother     Social History Social History   Tobacco Use   Smoking status: Never   Smokeless tobacco: Never  Vaping Use   Vaping Use: Never used  Substance Use Topics   Alcohol use: Not Currently    Comment: rare to occ. monthly   Drug use: No     Allergies   Lisinopril   Review of Systems Review of Systems  Constitutional:  Positive for activity change. Negative for appetite change, fatigue and fever.  HENT:  Positive for congestion, postnasal drip, sinus pressure and sore throat. Negative for ear pain, rhinorrhea and sneezing.   Respiratory:  Positive for cough. Negative for shortness of breath.   Gastrointestinal:  Negative for abdominal pain, diarrhea, nausea and vomiting.  Neurological:  Negative for dizziness, light-headedness and headaches.    Physical Exam Triage Vital Signs ED Triage Vitals  Enc Vitals Group     BP 03/25/21 1239 124/76  Pulse Rate 03/25/21 1239 73     Resp 03/25/21 1239 20     Temp 03/25/21 1239 98.5 F (36.9 C)     Temp Source 03/25/21 1239 Oral     SpO2 03/25/21 1239 97 %     Weight --      Height --      Head Circumference --      Peak Flow --      Pain Score 03/25/21 1237 8     Pain Loc --      Pain Edu? --      Excl. in [place]? --    No data found.  Updated Vital Signs BP 124/76 (BP Location: Right Arm)   Pulse 73   Temp 98.5 F (36.9 C) (Oral)   Resp 20   SpO2 97%   Visual Acuity Right Eye Distance:   Left Eye  Distance:   Bilateral Distance:    Right Eye Near:   Left Eye Near:    Bilateral Near:     Physical Exam Vitals reviewed.  Constitutional:      General: He is awake.     Appearance: Normal appearance. He is well-developed. He is not ill-appearing.     Comments: Very pleasant male appears stated age no acute distress sitting comfortably in exam room  HENT:     Head: Normocephalic and atraumatic.     Right Ear: Tympanic membrane, ear canal and external ear normal. Tympanic membrane is not erythematous or bulging.     Left Ear: Tympanic membrane, ear canal and external ear normal. Tympanic membrane is not erythematous or bulging.     Nose:     Right Sinus: Maxillary sinus tenderness and frontal sinus tenderness present.     Left Sinus: Maxillary sinus tenderness and frontal sinus tenderness present.     Mouth/Throat:     Pharynx: Uvula midline. Posterior oropharyngeal erythema present. No oropharyngeal exudate.     Comments: Significant erythema and drainage in posterior oropharynx Cardiovascular:     Rate and Rhythm: Normal rate and regular rhythm.     Heart sounds: Normal heart sounds, S1 normal and S2 normal. No murmur heard. Pulmonary:     Effort: Pulmonary effort is normal. No accessory muscle usage or respiratory distress.     Breath sounds: Normal breath sounds. No stridor. No wheezing, rhonchi or rales.     Comments: Clear to auscultation bilaterally Neurological:     Mental Status: He is alert.  Psychiatric:        Behavior: Behavior is cooperative.     UC Treatments / Results  Labs (all labs ordered are listed, but only abnormal results are displayed) Labs Reviewed - No data to display  EKG   Radiology No results found.  Procedures Procedures (including critical care time)  Medications Ordered in UC Medications - No data to display  Initial Impression / Assessment and Plan / UC Course  I have reviewed the triage vital signs and the nursing  notes.  Pertinent labs & imaging results that were available during my care of the patient were reviewed by me and considered in my medical decision making (see chart for details).     No indication for viral testing given patient has been symptomatic for several weeks and this would not change management.  Given prolonged and worsening symptoms we will treat with antibiotics for suspected sinus infection.  Patient started on Augmentin twice daily for 7 days.  Recommended over-the-counter medication including Flonase and Mucinex  for additional symptom relief.  Recommended follow-up with primary care provider if symptoms or not improving for referral to ENT.  Discussed alarm symptoms that warrant emergent evaluation.  Strict return precautions given to which he expressed understanding.  Final Clinical Impressions(s) / UC Diagnoses   Final diagnoses:  Acute non-recurrent pansinusitis  Post-nasal drainage     Discharge Instructions      I believe you have a sinus infection.  Please start Augmentin twice daily for 7 days.  Use Flonase 1 spray in each nostril twice daily for 1 week then decrease to maintenance dose of 1 spray in each nostril thereafter.  I believe that allergies are contributing to your symptoms but you may benefit from an antihistamine such as Claritin or Zyrtec.  If your symptoms are not improving please follow-up with your primary care provider.  If at any point have worsening symptoms including high fever, severe cough, shortness of breath, chest discomfort, nausea/vomiting you need to go to the emergency room.     ED Prescriptions     Medication Sig Dispense Auth. Provider   amoxicillin-clavulanate (AUGMENTIN) 875-125 MG tablet Take 1 tablet by mouth every 12 (twelve) hours. 14 tablet Lille Karim K, PA-C   fluticasone (FLONASE) 50 MCG/ACT nasal spray Place 1 spray into both nostrils daily. 16 g Makenzi Bannister K, PA-C      PDMP not reviewed this encounter.   Terrilee Croak, PA-C 03/25/21 1307

## 2021-03-25 NOTE — ED Triage Notes (Signed)
Symptoms started 03/11/2021, has not been seen for this.  Patient has sore throat, chills, itchy throat, cough, runny nose and stuffy nose

## 2021-04-04 ENCOUNTER — Encounter (HOSPITAL_COMMUNITY): Payer: Self-pay

## 2021-04-04 ENCOUNTER — Other Ambulatory Visit: Payer: Self-pay

## 2021-04-04 ENCOUNTER — Ambulatory Visit (HOSPITAL_COMMUNITY)
Admission: EM | Admit: 2021-04-04 | Discharge: 2021-04-04 | Disposition: A | Discharge status: Home / Self Care | Payer: Medicaid Other | Attending: Physician Assistant | Admitting: Physician Assistant

## 2021-04-04 DIAGNOSIS — J029 Acute pharyngitis, unspecified: Secondary | ICD-10-CM

## 2021-04-04 LAB — POCT INFECTIOUS MONO SCREEN, ED / UC: Mono Screen: NEGATIVE

## 2021-04-04 LAB — POCT RAPID STREP A, ED / UC: Streptococcus, Group A Screen (Direct): NEGATIVE

## 2021-04-04 MED ORDER — PREDNISONE 20 MG PO TABS: 40.0000 mg | ORAL_TABLET | Freq: Every day | ORAL | 0 refills | Status: AC

## 2021-04-04 NOTE — ED Triage Notes (Signed)
Pt presents with c/o sore throat. States he was here 10 days ago and states he has not gotten better.

## 2021-04-04 NOTE — Discharge Instructions (Signed)
Your strep and monotest were negative.  Please start prednisone 40 mg for 4 days.  You should not take NSAIDs including aspirin, ibuprofen/Advil, naproxen/Aleve with this medication as can cause stomach bleeding.  You can use Tylenol as well as gargle with warm salt water for symptom relief.  If you have any difficulty speaking, difficulty swallowing, shortness of breath you need to go to the emergency room.  If symptoms or not improving please follow-up with ENT as we discussed.

## 2021-04-04 NOTE — ED Provider Notes (Signed)
[place]    CSN: [id number] Arrival date & time: 04/04/21  1253      History   Chief Complaint Chief Complaint  Patient presents with   Sore Throat    HPI John Collins is a 60 y.o. male.   Patient presents today with a several day history of recurrent sore throat.  He was seen 03/25/2021 at which point he had a several week history of URI symptoms including cough and congestion and was prescribed Flonase and Augmentin.  Reports that symptoms have significantly improved with Augmentin and character of sore throat has changed.  Reports that he is now experiencing pain on a right posterior oropharynx, described as aching, worse with swallowing, no alleviating factors identified.  He denies any ongoing URI symptoms including fever, cough, congestion, shortness of breath, chest discomfort.  He has not tried any over-the-counter medication for symptom management.  Reports he is able to eat and drink without difficulty.  Denies any change to voice.   Past Medical History:  Diagnosis Date   Cervical radiculopathy    Hypertension     Patient Active Problem List   Diagnosis Date Noted   Lateral epicondylitis of right elbow 07/31/2018   Mixed hyperlipidemia 03/01/2014   Essential hypertension, benign 11/11/2013    Past Surgical History:  Procedure Laterality Date   COLONOSCOPY WITH PROPOFOL N/A 03/01/2014   Procedure: COLONOSCOPY WITH PROPOFOL;  Surgeon: Garlan Fair, MD;  Location: [facility];  Service: Endoscopy;  Laterality: N/A;       Home Medications    Prior to Admission medications   Medication Sig Start Date End Date Taking? Authorizing Provider  predniSONE (DELTASONE) 20 MG tablet Take 2 tablets (40 mg total) by mouth daily for 4 days. 04/04/21 04/08/21 Yes Cassundra Mckeever K, PA-C  amLODipine (NORVASC) 10 MG tablet Take 1 tablet (10 mg total) by mouth daily. 07/06/20   Charlott Rakes, MD  atorvastatin (LIPITOR) 20 MG tablet Take 1 tablet (20 mg total) by  mouth daily. 07/06/20   Charlott Rakes, MD  fluticasone (FLONASE) 50 MCG/ACT nasal spray Place 1 spray into both nostrils daily. 03/25/21   Octaviano Mukai, Derry Skill, PA-C  Misc. Devices MISC CPAP therapy on 11 cm H2O or autopap. Small size [employer]kel Full Face Mask Simplus mask and heated humidification.  Diagnosis obstructive sleep apnea 07/04/20   Charlott Rakes, MD  triamcinolone cream (KENALOG) 0.1 % Apply 1 application topically 2 (two) times daily. Patient not taking: Reported on 01/18/2020 09/09/19   Charlott Rakes, MD    Family History Family History  Problem Relation Age of Onset   Thyroid disease Mother     Social History Social History   Tobacco Use   Smoking status: Never   Smokeless tobacco: Never  Vaping Use   Vaping Use: Never used  Substance Use Topics   Alcohol use: Not Currently    Comment: rare to occ. monthly   Drug use: No     Allergies   Lisinopril   Review of Systems Review of Systems  Constitutional:  Positive for activity change. Negative for appetite change, fatigue and fever.  HENT:  Positive for sore throat. Negative for congestion, sinus pressure and sneezing.   Respiratory:  Negative for cough and shortness of breath.   Cardiovascular:  Negative for chest pain.  Gastrointestinal:  Negative for abdominal pain, diarrhea, nausea and vomiting.  Neurological:  Negative for dizziness, light-headedness and headaches.    Physical Exam Triage Vital Signs ED Triage Vitals  Enc  Vitals Group     BP 04/04/21 1422 120/75     Pulse Rate 04/04/21 1422 67     Resp 04/04/21 1422 18     Temp 04/04/21 1422 98.6 F (37 C)     Temp Source 04/04/21 1422 Oral     SpO2 04/04/21 1422 97 %     Weight --      Height --      Head Circumference --      Peak Flow --      Pain Score 04/04/21 1421 4     Pain Loc --      Pain Edu? --      Excl. in [place]? --    No data found.  Updated Vital Signs BP 120/75 (BP Location: Left Arm)   Pulse 67   Temp 98.6 F (37 C)  (Oral)   Resp 18   SpO2 97%   Visual Acuity Right Eye Distance:   Left Eye Distance:   Bilateral Distance:    Right Eye Near:   Left Eye Near:    Bilateral Near:     Physical Exam Vitals reviewed.  Constitutional:      General: He is awake.     Appearance: Normal appearance. He is well-developed. He is not ill-appearing.     Comments: Very pleasant male appears stated age no acute distress sitting comfortably in exam room  HENT:     Head: Normocephalic and atraumatic.     Right Ear: Tympanic membrane, ear canal and external ear normal. Tympanic membrane is not erythematous or bulging.     Left Ear: Tympanic membrane, ear canal and external ear normal. Tympanic membrane is not erythematous or bulging.     Nose: Nose normal.     Mouth/Throat:     Pharynx: Uvula midline. Posterior oropharyngeal erythema present. No oropharyngeal exudate.     Tonsils: No tonsillar exudate or tonsillar abscesses.  Cardiovascular:     Rate and Rhythm: Normal rate and regular rhythm.     Heart sounds: Normal heart sounds, S1 normal and S2 normal. No murmur heard. Pulmonary:     Effort: Pulmonary effort is normal. No accessory muscle usage or respiratory distress.     Breath sounds: Normal breath sounds. No stridor. No wheezing, rhonchi or rales.     Comments: Clear to auscultation bilaterally Lymphadenopathy:     Head:     Right side of head: No submental, submandibular or tonsillar adenopathy.     Left side of head: No submental, submandibular or tonsillar adenopathy.     Cervical: No cervical adenopathy.  Neurological:     Mental Status: He is alert.  Psychiatric:        Behavior: Behavior is cooperative.     UC Treatments / Results  Labs (all labs ordered are listed, but only abnormal results are displayed) Labs Reviewed  POCT RAPID STREP A, ED / UC  POCT INFECTIOUS MONO SCREEN, ED / UC    EKG   Radiology No results found.  Procedures Procedures (including critical care  time)  Medications Ordered in UC Medications - No data to display  Initial Impression / Assessment and Plan / UC Course  I have reviewed the triage vital signs and the nursing notes.  Pertinent labs & imaging results that were available during my care of the patient were reviewed by me and considered in my medical decision making (see chart for details).     Strep and mono testing were negative in clinic  today.  No evidence of acute infection that warrant initiation of antibiotics on exam today.  We will start prednisone for symptomatic relief.  Patient was instructed not to take NSAIDs with prednisone burst due to risk of GI bleeding.  Can use Tylenol for breakthrough pain.  Encouraged him to gargle with warm salt water and use a humidifier for additional symptom relief.  Discussed alarm symptoms that warrant going to the emergency room including difficulty speaking, difficulty swallowing, shortness of breath, swelling of throat or mouth.  He is to follow-up with ENT if symptoms do not improve with conservative treatment and was given contact information for local provider.  Strict return precautions given to which he expressed understanding.  Final Clinical Impressions(s) / UC Diagnoses   Final diagnoses:  Pharyngitis, unspecified etiology  Sore throat     Discharge Instructions      Your strep and monotest were negative.  Please start prednisone 40 mg for 4 days.  You should not take NSAIDs including aspirin, ibuprofen/Advil, naproxen/Aleve with this medication as can cause stomach bleeding.  You can use Tylenol as well as gargle with warm salt water for symptom relief.  If you have any difficulty speaking, difficulty swallowing, shortness of breath you need to go to the emergency room.  If symptoms or not improving please follow-up with ENT as we discussed.     ED Prescriptions     Medication Sig Dispense Auth. Provider   predniSONE (DELTASONE) 20 MG tablet Take 2 tablets (40 mg  total) by mouth daily for 4 days. 8 tablet Maeola Mchaney, Derry Skill, PA-C      PDMP not reviewed this encounter.   Terrilee Croak, PA-C 04/04/21 1515

## 2021-05-20 ENCOUNTER — Other Ambulatory Visit: Payer: Self-pay | Admitting: Family Medicine

## 2021-05-20 DIAGNOSIS — I10 Essential (primary) hypertension: Secondary | ICD-10-CM

## 2021-05-21 NOTE — Telephone Encounter (Signed)
Requested Prescriptions  Pending Prescriptions Disp Refills   amLODipine (NORVASC) 10 MG tablet [Pharmacy Med Name: AMLODIPINE BESYLATE 10 MG TAB] 32 tablet 0    Sig: TAKE 1 TABLET BY MOUTH EVERY DAY     Cardiovascular:  Calcium Channel Blockers Failed - 05/20/2021  1:24 PM      Failed - Valid encounter within last 6 months    Recent Outpatient Visits          1 year ago Suspected sleep apnea   [place], [place]   1 year ago Dermatitis   [place], [place]obong, MD   2 years ago Lateral epicondylitis of right elbow   [place], Enobong, MD   2 years ago Lateral epicondylitis of right elbow   [place], Patrick E, MD   3 years ago Laceration of left wrist, subsequent encounter   [place], Enobong, MD      Future Appointments            In 1 month [place], Dionne Bucy, [place] BP in normal range    BP Readings from Last 1 Encounters:  04/04/21 120/75

## 2021-05-26 ENCOUNTER — Emergency Department (HOSPITAL_COMMUNITY): Payer: Medicaid Other

## 2021-05-26 ENCOUNTER — Emergency Department (HOSPITAL_COMMUNITY)
Admission: EM | Admit: 2021-05-26 | Discharge: 2021-05-26 | Disposition: A | Discharge status: Home / Self Care | Payer: Medicaid Other | Attending: Emergency Medicine | Admitting: Emergency Medicine

## 2021-05-26 DIAGNOSIS — Z79899 Other long term (current) drug therapy: Secondary | ICD-10-CM | POA: Insufficient documentation

## 2021-05-26 DIAGNOSIS — R519 Headache, unspecified: Secondary | ICD-10-CM | POA: Diagnosis not present

## 2021-05-26 DIAGNOSIS — R0789 Other chest pain: Secondary | ICD-10-CM | POA: Diagnosis not present

## 2021-05-26 DIAGNOSIS — S0990XA Unspecified injury of head, initial encounter: Secondary | ICD-10-CM | POA: Insufficient documentation

## 2021-05-26 DIAGNOSIS — I10 Essential (primary) hypertension: Secondary | ICD-10-CM | POA: Diagnosis not present

## 2021-05-26 NOTE — ED Triage Notes (Signed)
Pt. Stated, right now I feel it spasm and tingling.
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
## 2021-05-26 NOTE — Discharge Instructions (Signed)
You may alternate taking Tylenol and ibuprofen for your pain every 3 hours as needed.  Never take more than 4000 mg of Tylenol in 1 day.

## 2021-05-27 ENCOUNTER — Telehealth: Payer: Self-pay

## 2021-05-27 NOTE — Telephone Encounter (Signed)
Transition Care Management Follow-up Telephone Call Date of discharge and from where: 05/26/2021-[place]  How have you been since you were released from the hospital? Patient stated he is doing ok but still has back pain. Patient was advised to go ahead and start the Ibuprofen Any questions or concerns? No  Items Reviewed: Did the pt receive and understand the discharge instructions provided? Yes  Medications obtained and verified?  No new medications was given at discharge.patient was instructed to start Ibuprofen. OTC. Other? No  Any new allergies since your discharge? No  Dietary orders reviewed? No Do you have support at home? No patient stated he only has the kids at home.   Home Care and Equipment/Supplies: Were home health services ordered? not applicable If so, what is the name of the agency? N/A  Has the agency set up a time to come to the patient's home? not applicable Were any new equipment or medical supplies ordered?  No What is the name of the medical supply agency? N/A Were you able to get the supplies/equipment? not applicable Do you have any questions related to the use of the equipment or supplies? No  Functional Questionnaire: (I = Independent and D = Dependent) ADLs: I  Bathing/Dressing- I  Meal Prep- I  Eating- I  Maintaining continence- I  Transferring/Ambulation- I  Managing Meds- I  Follow up appointments reviewed:  PCP Hospital f/u appt confirmed? No   Specialist Hospital f/u appt confirmed? No   Are transportation arrangements needed? No  If their condition worsens, is the pt aware to call PCP or go to the Emergency Dept.? Yes Was the patient provided with contact information for the PCP's office or ED? Yes Was to pt encouraged to call back with questions or concerns? Yes
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
## 2021-06-21 ENCOUNTER — Encounter: Payer: Self-pay | Admitting: Physician Assistant

## 2021-06-21 ENCOUNTER — Other Ambulatory Visit: Payer: Self-pay

## 2021-06-21 ENCOUNTER — Ambulatory Visit: Payer: Medicaid Other | Attending: Physician Assistant | Admitting: Physician Assistant

## 2021-06-21 VITALS — BP 136/80 | HR 71 | Ht 68.0 in | Wt 199.0 lb

## 2021-06-21 DIAGNOSIS — Z131 Encounter for screening for diabetes mellitus: Secondary | ICD-10-CM

## 2021-06-21 DIAGNOSIS — E782 Mixed hyperlipidemia: Secondary | ICD-10-CM | POA: Diagnosis not present

## 2021-06-21 DIAGNOSIS — I10 Essential (primary) hypertension: Secondary | ICD-10-CM

## 2021-06-21 DIAGNOSIS — Z125 Encounter for screening for malignant neoplasm of prostate: Secondary | ICD-10-CM

## 2021-06-21 MED ORDER — ATORVASTATIN CALCIUM 20 MG PO TABS: 20.0000 mg | ORAL_TABLET | Freq: Every day | ORAL | 1 refills | Status: DC

## 2021-06-21 MED ORDER — AMLODIPINE BESYLATE 10 MG PO TABS: 10.0000 mg | ORAL_TABLET | Freq: Every day | ORAL | 1 refills | Status: DC

## 2021-06-21 NOTE — Progress Notes (Signed)
Patient ID: John Collins, male   DOB: 1960-09-18, 61 y.o.   MRN: [id number]   John Collins, is a 61 y.o. male  VPX:[id number]  IOE:[id number]  DOB - [birth year]  Chief Complaint  Patient presents with   Hypertension       Subjective:   John Collins is a 61 y.o. male here today for a follow up visit forBP and cholesterol.  Has not taken either med today.  BP OOO usu about 120-130/80s.  No new issues or concerns.  Compliant with meds.  Wants to have PSA checked.  He has not had labs since 2021.  Patient has No headache, No chest pain, No abdominal pain - No Nausea, No new weakness tingling or numbness, No Cough - SOB.  No problems updated.  ALLERGIES: Allergies  Allergen Reactions   Lisinopril     Cough     PAST MEDICAL HISTORY: Past Medical History:  Diagnosis Date   Cervical radiculopathy    Hypertension     MEDICATIONS AT HOME: Prior to Admission medications   Medication Sig Start Date End Date Taking? Authorizing Provider  amLODipine (NORVASC) 10 MG tablet Take 1 tablet (10 mg total) by mouth daily. 06/21/21   Argentina Donovan, PA-C  atorvastatin (LIPITOR) 20 MG tablet Take 1 tablet (20 mg total) by mouth daily. 06/21/21   Argentina Donovan, PA-C  fluticasone (FLONASE) 50 MCG/ACT nasal spray Place 1 spray into both nostrils daily. Patient not taking: Reported on 06/21/2021 03/25/21   Raspet, Derry Skill, PA-C  Misc. Devices MISC CPAP therapy on 11 cm H2O or autopap. Small size [employer]kel Full Face Mask Simplus mask and heated humidification.  Diagnosis obstructive sleep apnea Patient not taking: Reported on 06/21/2021 07/04/20   Charlott Rakes, MD  triamcinolone cream (KENALOG) 0.1 % Apply 1 application topically 2 (two) times daily. Patient not taking: Reported on 01/18/2020 09/09/19   Charlott Rakes, MD    ROS: Neg HEENT Neg resp Neg cardiac Neg GI Neg GU Neg MS Neg psych Neg neuro  Objective:   Vitals:   06/21/21 0914  BP: 136/80  Pulse: 71  SpO2: 100%   Weight: 199 lb (90.3 kg)  Height: 5\' 8"  (1.727 m)   Exam General appearance : Awake, alert, not in any distress. Speech Clear. Not toxic looking HEENT: Atraumatic and Normocephalic Neck: Supple, no JVD. No cervical lymphadenopathy.  Chest: Good air entry bilaterally, CTAB.  No rales/rhonchi/wheezing CVS: S1 S2 regular, no murmurs.  Extremities: B/L Lower Ext shows no edema, both legs are warm to touch Neurology: Awake alert, and oriented X 3, CN II-XII intact, Non focal Skin: No Rash  Data Review Lab Results  Component Value Date   HGBA1C 5.4 09/09/2019    Assessment & Plan   1. Essential hypertension, benign At goal.  We have discussed target BP range and blood pressure goal. I have advised patient to check BP regularly and to call us back or report to clinic if the numbers are consistently higher than 140/90. We discussed the importance of compliance with medical therapy and DASH diet recommended, consequences of uncontrolled hypertension discussed.  - amLODipine (NORVASC) 10 MG tablet; Take 1 tablet (10 mg total) by mouth daily.  Dispense: 90 tablet; Refill: 1 - Comprehensive metabolic panel - CBC with Differential/Platelet  2. Mixed hyperlipidemia - atorvastatin (LIPITOR) 20 MG tablet; Take 1 tablet (20 mg total) by mouth daily.  Dispense: 80 tablet; Refill: 1 - Lipid panel - Comprehensive metabolic panel - CBC with Differential/Platelet  3. Screening for prostate cancer - PSA  4. Screening for diabetes mellitus Limit sugar and starchy food intake - Hemoglobin A1c    Patient have been counseled extensively about nutrition and exercise. Other issues discussed during this visit include: low cholesterol diet, weight control and daily exercise, foot care, annual eye examinations at Ophthalmology, importance of adherence with medications and regular follow-up. We also discussed long term complications of uncontrolled diabetes and hypertension.   Return in about 6 months  (around 12/19/2021) for PCP/chronic conditions.  The patient was given clear instructions to go to ER or return to medical center if symptoms don't improve, worsen or new problems develop. The patient verbalized understanding. The patient was told to call to get lab results if they haven't heard anything in the next week.      Freeman Caldron, PA-C [facility] and [facility] [place], [place]   06/21/2021, 9:40 AM

## 2021-06-22 ENCOUNTER — Other Ambulatory Visit: Payer: Self-pay | Admitting: Physician Assistant

## 2021-06-22 DIAGNOSIS — R7303 Prediabetes: Secondary | ICD-10-CM

## 2021-06-22 LAB — COMPREHENSIVE METABOLIC PANEL
ALT: 17 IU/L (ref 0–44)
AST: 15 IU/L (ref 0–40)
Albumin/Globulin Ratio: 1.9 (ref 1.2–2.2)
Albumin: 4.8 g/dL (ref 3.8–4.9)
Alkaline Phosphatase: 139 IU/L — ABNORMAL HIGH (ref 44–121)
BUN/Creatinine Ratio: 10 (ref 10–24)
BUN: 8 mg/dL (ref 8–27)
Bilirubin Total: 0.3 mg/dL (ref 0.0–1.2)
CO2: 23 mmol/L (ref 20–29)
Calcium: 9.1 mg/dL (ref 8.6–10.2)
Chloride: 104 mmol/L (ref 96–106)
Creatinine, Ser: 0.8 mg/dL (ref 0.76–1.27)
Globulin, Total: 2.5 g/dL (ref 1.5–4.5)
Glucose: 141 mg/dL — ABNORMAL HIGH (ref 70–99)
Potassium: 4.6 mmol/L (ref 3.5–5.2)
Sodium: 141 mmol/L (ref 134–144)
Total Protein: 7.3 g/dL (ref 6.0–8.5)
eGFR: 101 mL/min/{1.73_m2} (ref >59)

## 2021-06-22 LAB — CBC WITH DIFFERENTIAL/PLATELET
Basophils Absolute: 0 10*3/uL (ref 0.0–0.2)
Basos: 1 %
EOS (ABSOLUTE): 0.1 10*3/uL (ref 0.0–0.4)
Eos: 2 %
Hematocrit: 42.1 % (ref 37.5–51.0)
Hemoglobin: 13.5 g/dL (ref 13.0–17.7)
Immature Grans (Abs): 0 10*3/uL (ref 0.0–0.1)
Immature Granulocytes: 0 %
Lymphocytes Absolute: 2 10*3/uL (ref 0.7–3.1)
Lymphs: 40 %
MCH: 28.6 pg (ref 26.6–33.0)
MCHC: 32.1 g/dL (ref 31.5–35.7)
MCV: 89 fL (ref 79–97)
Monocytes Absolute: 0.4 10*3/uL (ref 0.1–0.9)
Monocytes: 9 %
Neutrophils Absolute: 2.3 10*3/uL (ref 1.4–7.0)
Neutrophils: 48 %
Platelets: 251 10*3/uL (ref 150–450)
RBC: 4.72 x10E6/uL (ref 4.14–5.80)
RDW: 11.8 % (ref 11.6–15.4)
WBC: 4.9 10*3/uL (ref 3.4–10.8)

## 2021-06-22 LAB — LIPID PANEL
Chol/HDL Ratio: 3.8 ratio (ref 0.0–5.0)
Cholesterol, Total: 182 mg/dL (ref 100–199)
HDL: 48 mg/dL (ref >39)
LDL Chol Calc (NIH): 118 mg/dL — ABNORMAL HIGH (ref 0–99)
Triglycerides: 84 mg/dL (ref 0–149)
VLDL Cholesterol Cal: 16 mg/dL (ref 5–40)

## 2021-06-22 LAB — PSA: Prostate Specific Ag, Serum: 0.8 ng/mL (ref 0.0–4.0)

## 2021-06-22 LAB — HEMOGLOBIN A1C
Est. average glucose Bld gHb Est-mCnc: 137 mg/dL
Hgb A1c MFr Bld: 6.4 % — ABNORMAL HIGH (ref 4.8–5.6)

## 2021-06-22 MED ORDER — METFORMIN HCL 500 MG PO TABS: 500.0000 mg | ORAL_TABLET | Freq: Two times a day (BID) | ORAL | 3 refills | Status: DC

## 2021-06-23 ENCOUNTER — Telehealth: Payer: Self-pay | Admitting: *Deleted

## 2021-06-23 NOTE — Telephone Encounter (Signed)
Yes, he needs a visit. Could be virtual.

## 2021-06-23 NOTE — Telephone Encounter (Signed)
Patient called with lab result note and recommendation per Provider McClung,PA-C.  Patient verbalized understanding.   Patient states he forgot to mention at the [facility] concerns with hesitancy with voiding. He started he has doesn't feel like he empties the bladder, he stops and goes. The PSA result jarred his memory with the concern.  Please advise if patient needs another OV to address concern.

## 2021-06-23 NOTE — Telephone Encounter (Signed)
-----   Message from Argentina Donovan, [place] sent at 06/22/2021  9:48 AM EST ----- You are in the high end of prediabetes/bordering on diabetes.  Please aggressively eliminate sugars and starches for your diet.  I also sent you a prescription of metformin to start to help with this.  Better blood sugar control will also help with cholesterol along with the atorvastatin.  Kidney function, liver function, and electrolytes are normal. Prostate function is normal.  Blood count is normal.  Follow up in 3 months.  Thanks, Freeman Caldron, PA-C

## 2021-06-26 NOTE — Telephone Encounter (Signed)
Scheduled a virtual appointment for patient with PCP.

## 2021-06-27 ENCOUNTER — Other Ambulatory Visit: Payer: Self-pay

## 2021-06-27 ENCOUNTER — Ambulatory Visit: Payer: Medicaid Other | Attending: Family Medicine | Admitting: Family Medicine

## 2021-06-27 DIAGNOSIS — R3914 Feeling of incomplete bladder emptying: Secondary | ICD-10-CM | POA: Diagnosis not present

## 2021-06-27 DIAGNOSIS — N401 Enlarged prostate with lower urinary tract symptoms: Secondary | ICD-10-CM

## 2021-06-27 MED ORDER — TAMSULOSIN HCL 0.4 MG PO CAPS: 0.4000 mg | ORAL_CAPSULE | Freq: Every day | ORAL | 6 refills | Status: DC

## 2021-06-27 NOTE — Progress Notes (Signed)
Virtual Visit via Telephone Note  I connected with [employer], on 06/27/2021 at 8:11 AM by telephone due to the COVID-19 pandemic and verified that I am speaking with the correct person using two identifiers.   Consent: I discussed the limitations, risks, security and privacy concerns of performing an evaluation and management service by telephone and the availability of in person appointments. I also discussed with the patient that there may be a patient responsible charge related to this service. The patient expressed understanding and agreed to proceed.   Location of Patient: Home  Location of Provider: Clinic   Persons participating in Telemedicine visit: John Collins Dr. Margarita Rana     History of Present Illness: John Collins is a 61 y.o. year old male with a history of hypertension, hyperlipidemia seen for an acute visit.   He complains of taking a longer time to completely empty his bladder, he has intermittent dribbling .He has no frequency, urgency, dysuria or nocturia. Denies presence of hematuria. Last PSA was normal in 06/2021. Past Medical History:  Diagnosis Date   Cervical radiculopathy    Hypertension    Allergies  Allergen Reactions   Lisinopril     Cough     Current Outpatient Medications on File Prior to Visit  Medication Sig Dispense Refill   amLODipine (NORVASC) 10 MG tablet Take 1 tablet (10 mg total) by mouth daily. 90 tablet 1   atorvastatin (LIPITOR) 20 MG tablet Take 1 tablet (20 mg total) by mouth daily. 80 tablet 1   fluticasone (FLONASE) 50 MCG/ACT nasal spray Place 1 spray into both nostrils daily. (Patient not taking: Reported on 06/21/2021) 16 g 0   metFORMIN (GLUCOPHAGE) 500 MG tablet Take 1 tablet (500 mg total) by mouth 2 (two) times daily with a meal. 180 tablet 3   Misc. Devices MISC CPAP therapy on 11 cm H2O or autopap. Small size Fisher[employer]Paykel Full Face Mask Simplus mask and heated humidification.  Diagnosis obstructive sleep  apnea (Patient not taking: Reported on 06/21/2021) 1 each 0   triamcinolone cream (KENALOG) 0.1 % Apply 1 application topically 2 (two) times daily. (Patient not taking: Reported on 01/18/2020) 30 g 1   No current facility-administered medications on file prior to visit.    ROS: See HPI  Observations/Objective: Awake, alert, oriented x3 Not in acute distress Normal mood   CMP Latest Ref Rng & Units 06/21/2021 09/09/2019 12/17/2017  Glucose 70 - 99 mg/dL 141(H) 102(H) 110(H)  BUN 8 - 27 mg/dL 8 13 8   Creatinine 0.76 - 1.27 mg/dL 0.80 0.94 0.78  Sodium 134 - 144 mmol/L 141 143 142  Potassium 3.5 - 5.2 mmol/L 4.6 4.1 4.3  Chloride 96 - 106 mmol/L 104 106 103  CO2 20 - 29 mmol/L 23 25 21   Calcium 8.6 - 10.2 mg/dL 9.1 9.5 9.1  Total Protein 6.0 - 8.5 g/dL 7.3 7.3 7.1  Total Bilirubin 0.0 - 1.2 mg/dL 0.3 0.5 0.5  Alkaline Phos 44 - 121 IU/L 139(H) 80 91  AST 0 - 40 IU/L 15 19 21   ALT 0 - 44 IU/L 17 20 20     Lipid Panel     Component Value Date/Time   CHOL 182 06/21/2021 0944   TRIG 84 06/21/2021 0944   HDL 48 06/21/2021 0944   CHOLHDL 3.8 06/21/2021 0944   CHOLHDL 4.2 11/11/2013 1623   VLDL 18 11/11/2013 1623   LDLCALC 118 (H) 06/21/2021 0944   LABVLDL 16 06/21/2021 0944    Lab Results  Component Value Date   HGBA1C 6.4 (H) 06/21/2021    Assessment and Plan: 1. Benign prostatic hyperplasia with incomplete bladder emptying PSA screen is normal - tamsulosin (FLOMAX) 0.4 MG CAPS capsule; Take 1 capsule (0.4 mg total) by mouth daily.  Dispense: 30 capsule; Refill: 6   Follow Up Instructions: Keep previously scheduled appointment   I discussed the assessment and treatment plan with the patient. The patient was provided an opportunity to ask questions and all were answered. The patient agreed with the plan and demonstrated an understanding of the instructions.   The patient was advised to call back or seek an in-person evaluation if the symptoms worsen or if the condition  fails to improve as anticipated.     I provided 9 minutes total of non-face-to-face time during this encounter.   Charlott Rakes, MD, FAAFP. [facility] and [place], [place]   06/27/2021, 8:11 AM

## 2021-07-19 DIAGNOSIS — F4320 Adjustment disorder, unspecified: Secondary | ICD-10-CM | POA: Diagnosis not present

## 2021-07-20 ENCOUNTER — Other Ambulatory Visit: Payer: Self-pay | Admitting: Family Medicine

## 2021-07-20 DIAGNOSIS — I10 Essential (primary) hypertension: Secondary | ICD-10-CM

## 2021-07-21 NOTE — Telephone Encounter (Signed)
Medication refused. Requested too soon. Last filled on 06/21/21 #90 with 1 refill.

## 2021-08-29 DIAGNOSIS — F4320 Adjustment disorder, unspecified: Secondary | ICD-10-CM | POA: Diagnosis not present

## 2021-09-17 ENCOUNTER — Other Ambulatory Visit: Payer: Self-pay | Admitting: Physician Assistant

## 2021-09-17 DIAGNOSIS — E782 Mixed hyperlipidemia: Secondary | ICD-10-CM

## 2021-09-18 NOTE — Telephone Encounter (Signed)
Requested Prescriptions  ?Pending Prescriptions Disp Refills  ?? atorvastatin (LIPITOR) 20 MG tablet [Pharmacy Med Name: ATORVASTATIN 20 MG TABLET] 90 tablet 0  ?  Sig: TAKE 1 TABLET BY MOUTH EVERY DAY  ?  ? Cardiovascular:  Antilipid - Statins Failed - 09/17/2021 12:14 AM  ?  ?  Failed - Lipid Panel in normal range within the last 12 months  ?  Cholesterol, Total  ?Date Value Ref Range Status  ?06/21/2021 182 100 - 199 mg/dL Final  ? ?LDL Chol Calc (NIH)  ?Date Value Ref Range Status  ?06/21/2021 118 (H) 0 - 99 mg/dL Final  ? ?HDL  ?Date Value Ref Range Status  ?06/21/2021 48 >39 mg/dL Final  ? ?Triglycerides  ?Date Value Ref Range Status  ?06/21/2021 84 0 - 149 mg/dL Final  ? ?  ?  ?  Passed - Patient is not pregnant  ?  ?  Passed - Valid encounter within last 12 months  ?  Recent Outpatient Visits   ?      ? 2 months ago Benign prostatic hyperplasia with incomplete bladder emptying  ? [place], Charlane Ferretti, MD  ? 2 months ago Screening for prostate cancer  ? [place], [place], [place]  ? 1 year ago Suspected sleep apnea  ? [place], MD  ? 2 years ago Dermatitis  ? [place], [place]  ? 3 years ago Lateral epicondylitis of right elbow  ? [place] Charlott Rakes, MD  ?  ?  ? ?  ?  ?  ? ? ?

## 2021-10-02 DIAGNOSIS — F4320 Adjustment disorder, unspecified: Secondary | ICD-10-CM | POA: Diagnosis not present

## 2022-01-10 DIAGNOSIS — F4320 Adjustment disorder, unspecified: Secondary | ICD-10-CM | POA: Diagnosis not present

## 2022-03-21 ENCOUNTER — Ambulatory Visit: Payer: Medicaid Other

## 2022-08-23 ENCOUNTER — Emergency Department (HOSPITAL_COMMUNITY): Payer: BC Managed Care – PPO

## 2022-08-23 ENCOUNTER — Encounter (HOSPITAL_COMMUNITY): Payer: Self-pay

## 2022-08-23 ENCOUNTER — Emergency Department (HOSPITAL_COMMUNITY)
Admission: EM | Admit: 2022-08-23 | Discharge: 2022-08-24 | Disposition: A | Discharge status: Home / Self Care | Payer: BC Managed Care – PPO | Attending: Emergency Medicine | Admitting: Emergency Medicine

## 2022-08-23 ENCOUNTER — Other Ambulatory Visit: Payer: Self-pay

## 2022-08-23 DIAGNOSIS — M25511 Pain in right shoulder: Secondary | ICD-10-CM | POA: Insufficient documentation

## 2022-08-23 NOTE — ED Provider Triage Note (Signed)
Emergency Medicine Provider Triage Evaluation Note  John Collins , a 62 y.o. male  was evaluated in triage.  Pt complains of right shoulder pain.  Patient states pain is worse after using his computer mouse throughout the day.  Pain has been ongoing for 1 month but is worsened over the past 1 to 2 days.  He denies any known injury to the area.  He states that pain today radiated upwards into the right side of his neck as well.  Review of Systems  Positive: As above Negative: As above  Physical Exam  There were no vitals taken for this visit. Gen:   Awake, no distress   Resp:  Normal effort  MSK:   Moves extremities without difficulty  Other:    Medical Decision Making  Medically screening exam initiated at 9:04 PM.  Appropriate orders placed.  John Collins was informed that the remainder of the evaluation will be completed by another provider, this initial triage assessment does not replace that evaluation, and the importance of remaining in the ED until their evaluation is complete.     Ronny Bacon 08/23/22 2105

## 2022-08-23 NOTE — ED Triage Notes (Signed)
Right shoulder pain x 1 month. Pain worst while working on the computer.

## 2022-08-24 ENCOUNTER — Telehealth: Payer: Self-pay | Admitting: *Deleted

## 2022-08-24 DIAGNOSIS — M25511 Pain in right shoulder: Secondary | ICD-10-CM | POA: Diagnosis not present

## 2022-08-24 MED ORDER — KETOROLAC TROMETHAMINE 15 MG/ML IJ SOLN
15.0000 mg | Freq: Once | INTRAMUSCULAR | Status: AC
  Administered 2022-08-24: 15 mg via INTRAMUSCULAR
  Filled 2022-08-24: qty 1

## 2022-08-24 MED ORDER — PREDNISONE 20 MG PO TABS: 40.0000 mg | ORAL_TABLET | Freq: Every day | ORAL | 0 refills | Status: DC

## 2022-08-24 NOTE — Discharge Instructions (Addendum)
Take prednisone as prescribed and follow up with your primary care doctor for recheck. Return for new or concerning symptoms.

## 2022-08-24 NOTE — Telephone Encounter (Signed)
Pt called regarding which pharmacy Rx was e-scribed to.  RNCM reviewed chart to access After Visit Summary and found that Rx was printed.  Advised pt to take Rx to the pharmacy of his choice.   Quran Vasco J. Clydene Laming, RN, BSN, NCM  Transitions of Care  Nurse Case Manager  [facility] Emergency Departments  Operative Services  [phone]

## 2022-08-24 NOTE — ED Provider Notes (Signed)
[facility] Provider Note   CSN: [id number] Arrival date & time: 08/23/22  2055     History  Chief Complaint  Patient presents with   Shoulder Pain    John Collins is a 62 y.o. male.  62 yo male presents to the ED today for complaints of right shoulder pain. He states that his symptoms started around 4 months ago, but worsened on Monday to "an unbearable pain". He states his pain is constant and is described as a burning pain. He has not tried any OTC medications for pain relief. He is right handed, but has not experienced any difficulty in writing or change in strength. Denies recent trauma, exercise, heavy lifting, numbness/weakness, or loss in sensation. He states that 7 years ago he did have neck pain with similar pain to the R shoulder, but he denies neck pain today. Neck pain was previously treated with an "injection" in 2014. He does not take any medications daily or have any known chronic comorbidities.    Shoulder Pain      Home Medications Prior to Admission medications   Medication Sig Start Date End Date Taking? Authorizing Provider  predniSONE (DELTASONE) 20 MG tablet Take 2 tablets (40 mg total) by mouth daily. Take 40 mg by mouth daily for 3 days, then 20mg  by mouth daily for 3 days, then 10mg  daily for 3 days 08/24/22  Yes Antonietta Breach, PA-C  amLODipine (NORVASC) 10 MG tablet Take 1 tablet (10 mg total) by mouth daily. 06/21/21   Argentina Donovan, PA-C  atorvastatin (LIPITOR) 20 MG tablet TAKE 1 TABLET BY MOUTH EVERY DAY 09/18/21   Charlott Rakes, MD  fluticasone (FLONASE) 50 MCG/ACT nasal spray Place 1 spray into both nostrils daily. Patient not taking: Reported on 06/21/2021 03/25/21   Raspet, Derry Skill, PA-C  metFORMIN (GLUCOPHAGE) 500 MG tablet Take 1 tablet (500 mg total) by mouth 2 (two) times daily with a meal. 06/22/21   Argentina Donovan, PA-C  Misc. Devices MISC CPAP therapy on 11 cm H2O or autopap. Small size  [employer] Full Face Mask Simplus mask and heated humidification.  Diagnosis obstructive sleep apnea Patient not taking: Reported on 06/21/2021 07/04/20   Charlott Rakes, MD  tamsulosin (FLOMAX) 0.4 MG CAPS capsule Take 1 capsule (0.4 mg total) by mouth daily. 06/27/21   Charlott Rakes, MD  triamcinolone cream (KENALOG) 0.1 % Apply 1 application topically 2 (two) times daily. Patient not taking: Reported on 01/18/2020 09/09/19   Charlott Rakes, MD      Allergies    Lisinopril    Review of Systems   Review of Systems Ten systems reviewed and are negative for acute change, except as noted in the HPI.    Physical Exam Updated Vital Signs BP (!) 148/89 (BP Location: Left Arm)   Pulse (!) 56   Temp 97.6 F (36.4 C) (Oral)   Resp 16   Ht 5\' 8"  (1.727 m)   Wt 90.7 kg   SpO2 98%   BMI 30.41 kg/m   Physical Exam Vitals and nursing note reviewed.  Constitutional:      General: He is not in acute distress.    Appearance: He is well-developed. He is not diaphoretic.     Comments: Nontoxic appearing and in NAD  HENT:     Head: Normocephalic and atraumatic.  Eyes:     General: No scleral icterus.    Conjunctiva/sclera: Conjunctivae normal.  Cardiovascular:     Rate and  Rhythm: Normal rate and regular rhythm.     Pulses: Normal pulses.     Comments: Distal radial pulse 2+ in the RUE Pulmonary:     Effort: Pulmonary effort is normal. No respiratory distress.     Comments: Respirations even and unlabored Musculoskeletal:        General: Normal range of motion.     Cervical back: Normal range of motion.     Comments: Preserved ROM of BUE. 5/5 strength against resistance of all major muscle groups b/l. Normal shoulder shrug against resistance. Grip strength 5/5, symmetric. Minimal TTP to insertion of R deltoid without crepitus, deformity, swelling, overlying erythema.  Skin:    General: Skin is warm and dry.     Coloration: Skin is not pale.     Findings: No erythema or rash.   Neurological:     Mental Status: He is alert and oriented to person, place, and time.     Sensory: No sensory deficit.     Coordination: Coordination normal.  Psychiatric:        Behavior: Behavior normal.     ED Results / Procedures / Treatments   Labs (all labs ordered are listed, but only abnormal results are displayed) Labs Reviewed - No data to display  EKG None  Radiology DG Shoulder Right  Result Date: 08/23/2022 CLINICAL DATA:  Right shoulder pain. EXAM: RIGHT SHOULDER - 2+ VIEW COMPARISON:  None Available. FINDINGS: There is no evidence of fracture or dislocation. There is no evidence of arthropathy or other focal bone abnormality. Soft tissues are unremarkable. IMPRESSION: Negative. Electronically Signed   By: Virgina Norfolk M.D.   On: 08/23/2022 21:27    Procedures Procedures    Medications Ordered in ED Medications  ketorolac (TORADOL) 15 MG/ML injection 15 mg (15 mg Intramuscular Given 08/24/22 0103)    ED Course/ Medical Decision Making/ A&P                             Medical Decision Making Risk Prescription drug management.   This patient presents to the ED for concern of R shoulder pain, this involves an extensive number of treatment options, and is a complaint that carries with it a high risk of complications and morbidity.  The differential diagnosis includes strain/sprain vs adhesive capsulitis vs fracture vs dislocation vs cervical radiculopathy vs septic joint.   Co morbidities that complicate the patient evaluation  None    Additional history obtained:  External records from outside source obtained and reviewed including prior cervical MRI from 2014 showing degenerative changes, disc protrusion.   Imaging Studies ordered:  I ordered imaging studies including Xray R shoulder  I independently visualized and interpreted imaging which showed no acute pathology I agree with the radiologist interpretation   Cardiac Monitoring:  The  patient was maintained on a cardiac monitor.  I personally viewed and interpreted the cardiac monitored which showed an underlying rhythm of: NSR   Medicines ordered and prescription drug management:  I ordered medication including Toradol for pain  Reevaluation of the patient after these medicines showed that the patient stayed the same I have reviewed the patients home medicines and have made adjustments as needed   Test Considered:  DG C-spine - however, patient neurovascularly intact without hx of trauma.   Problem List / ED Course:  Patient with R shoulder pain; burning in nature. Hx of C-spine degenerative changes with similar pain to the R deltoid; no  neck pain today. Denies trauma inciting symptoms. Chronicity of pain recurrence making infection less likely. Afebrile in the ED. Neurovascularly intact with preserved strength, sensation. Doubt central cord process. Right hand dominant; potential for overuse injury. May also have some radicular component. Will trial on outpatient steroid course.   Reevaluation:  After the interventions noted above, I reevaluated the patient and found that they have :stayed the same   Dispostion:  After consideration of the diagnostic results and the patients response to treatment, I feel that the patent would benefit from outpatient PCP follow up. Given Rx for prednisone taper for pain. Return precautions discussed and provided. Patient discharged in stable condition with no unaddressed concerns.          Final Clinical Impression(s) / ED Diagnoses Final diagnoses:  Acute pain of right shoulder    Rx / DC Orders ED Discharge Orders          Ordered    predniSONE (DELTASONE) 20 MG tablet  Daily        08/24/22 0056              Antonietta Breach, PA-C 08/24/22 0325    Orpah Greek, MD 08/25/22 [phone]

## 2022-08-25 ENCOUNTER — Encounter (HOSPITAL_COMMUNITY): Payer: Self-pay

## 2022-08-25 ENCOUNTER — Ambulatory Visit (HOSPITAL_COMMUNITY)
Admission: EM | Admit: 2022-08-25 | Discharge: 2022-08-25 | Disposition: A | Discharge status: Home / Self Care | Payer: BC Managed Care – PPO | Attending: Physician Assistant | Admitting: Physician Assistant

## 2022-08-25 DIAGNOSIS — M25511 Pain in right shoulder: Secondary | ICD-10-CM | POA: Diagnosis not present

## 2022-08-25 DIAGNOSIS — G8929 Other chronic pain: Secondary | ICD-10-CM | POA: Diagnosis not present

## 2022-08-25 DIAGNOSIS — M5412 Radiculopathy, cervical region: Secondary | ICD-10-CM

## 2022-08-25 MED ORDER — HYDROCODONE-ACETAMINOPHEN 5-325 MG PO TABS: 1.0000 | ORAL_TABLET | Freq: Four times a day (QID) | ORAL | 0 refills | Status: DC | PRN

## 2022-08-25 NOTE — ED Provider Notes (Signed)
[name]    CSN: [id number] Arrival date & time: 08/25/22  1517      History   Chief Complaint Chief Complaint  Patient presents with   Shoulder Pain    HPI John Collins is a 62 y.o. male.   62 year old male presents with right shoulder pain.  Patient indicates that over the past couple weeks he has been having increasing right upper arm and shoulder pain and discomfort that is aggravated by using a mouse with his right arm.  Patient indicates that the pain is worse with activity.  He indicates he was seen at the emergency room a couple days ago given a prescription for prednisone along with an anti-inflammatory.  He indicates he has been taking OTC medications and the prednisone without relief of symptoms.  He indicates he is not having any weakness, numbness or tingling.  Patient indicates he did have a cervical radiculopathy procedure which was in 2014 that involved an injection.  He indicates that this did relieve his symptoms and they have not returned until just recently.  Patient is requesting to go back to [facility] neurology that did the procedure it was Dr. Aliene Beams that participated and performed the procedure.  The patient is without fever or chills.   Shoulder Pain   Past Medical History:  Diagnosis Date   Cervical radiculopathy    Hypertension     Patient Active Problem List   Diagnosis Date Noted   Lateral epicondylitis of right elbow 07/31/2018   Mixed hyperlipidemia 03/01/2014   Essential hypertension, benign 11/11/2013    Past Surgical History:  Procedure Laterality Date   COLONOSCOPY WITH PROPOFOL N/A 03/01/2014   Procedure: COLONOSCOPY WITH PROPOFOL;  Surgeon: Garlan Fair, MD;  Location: [facility];  Service: Endoscopy;  Laterality: N/A;       Home Medications    Prior to Admission medications   Medication Sig Start Date End Date Taking? Authorizing Provider  HYDROcodone-acetaminophen (NORCO/VICODIN) 5-325 MG tablet Take 1-2  tablets by mouth every 6 (six) hours as needed. 08/25/22  Yes Nyoka Lint, PA-C  amLODipine (NORVASC) 10 MG tablet Take 1 tablet (10 mg total) by mouth daily. 06/21/21   Argentina Donovan, PA-C  atorvastatin (LIPITOR) 20 MG tablet TAKE 1 TABLET BY MOUTH EVERY DAY 09/18/21   Charlott Rakes, MD  fluticasone (FLONASE) 50 MCG/ACT nasal spray Place 1 spray into both nostrils daily. Patient not taking: Reported on 06/21/2021 03/25/21   Raspet, Derry Skill, PA-C  metFORMIN (GLUCOPHAGE) 500 MG tablet Take 1 tablet (500 mg total) by mouth 2 (two) times daily with a meal. 06/22/21   Argentina Donovan, PA-C  Misc. Devices MISC CPAP therapy on 11 cm H2O or autopap. Small size [employer] Full Face Mask Simplus mask and heated humidification.  Diagnosis obstructive sleep apnea Patient not taking: Reported on 06/21/2021 07/04/20   Charlott Rakes, MD  predniSONE (DELTASONE) 20 MG tablet Take 2 tablets (40 mg total) by mouth daily. Take 40 mg by mouth daily for 3 days, then 20mg  by mouth daily for 3 days, then 10mg  daily for 3 days 08/24/22   Antonietta Breach, PA-C  tamsulosin (FLOMAX) 0.4 MG CAPS capsule Take 1 capsule (0.4 mg total) by mouth daily. 06/27/21   Charlott Rakes, MD  triamcinolone cream (KENALOG) 0.1 % Apply 1 application topically 2 (two) times daily. Patient not taking: Reported on 01/18/2020 09/09/19   Charlott Rakes, MD    Family History Family History  Problem Relation Age of Onset  Thyroid disease Mother     Social History Social History   Tobacco Use   Smoking status: Never   Smokeless tobacco: Never  Vaping Use   Vaping Use: Never used  Substance Use Topics   Alcohol use: Not Currently    Comment: rare to occ. monthly   Drug use: No     Allergies   Lisinopril   Review of Systems Review of Systems  Musculoskeletal:  Positive for arthralgias (right shoulder pain).     Physical Exam Triage Vital Signs ED Triage Vitals [08/25/22 1622]  Enc Vitals Group     BP (!) 170/81      Pulse Rate 87     Resp 18     Temp 98.1 F (36.7 C)     Temp Source Oral     SpO2 95 %     Weight      Height      Head Circumference      Peak Flow      Pain Score      Pain Loc      Pain Edu?      Excl. in [place]?    No data found.  Updated Vital Signs BP (!) 170/81 (BP Location: Left Arm)   Pulse 87   Temp 98.1 F (36.7 C) (Oral)   Resp 18   SpO2 95%   Visual Acuity Right Eye Distance:   Left Eye Distance:   Bilateral Distance:    Right Eye Near:   Left Eye Near:    Bilateral Near:     Physical Exam Constitutional:      Appearance: Normal appearance.  Musculoskeletal:       Arms:     Comments: Right shoulder: Pain is palpated a long the right upper posterior arm area.  Pain is also palpated along the mid trapezius and lateral right side of the neck, range of motion is normal, stability is normal and strength is intact of the right upper extremity.  There is no crepitus with range of motion.  Neurological:     Mental Status: He is alert.      UC Treatments / Results  Labs (all labs ordered are listed, but only abnormal results are displayed) Labs Reviewed - No data to display  EKG   Radiology DG Shoulder Right  Result Date: 08/23/2022 CLINICAL DATA:  Right shoulder pain. EXAM: RIGHT SHOULDER - 2+ VIEW COMPARISON:  None Available. FINDINGS: There is no evidence of fracture or dislocation. There is no evidence of arthropathy or other focal bone abnormality. Soft tissues are unremarkable. IMPRESSION: Negative. Electronically Signed   By: Virgina Norfolk M.D.   On: 08/23/2022 21:27    Procedures Procedures (including critical care time)  Medications Ordered in UC Medications - No data to display  Initial Impression / Assessment and Plan / UC Course  I have reviewed the triage vital signs and the nursing notes.  Pertinent labs & imaging results that were available during my care of the patient were reviewed by me and considered in my medical decision  making (see chart for details).    Plan: The diagnosis will be treated with the following: 1.  Cervical radiculopathy right: A.  Vicodin tablets 5/325, 1-2 every 6-8 hours needed for acute pain. 2.  Right shoulder pain: A.  Advised to continue to take the prednisone and finish it to see if it would be of benefit in reducing the discomfort. 3.  Internal referral has been made to [facility]  neurology for evaluation and treatment. 4.  Advised to follow-up PCP return as needed. Final Clinical Impressions(s) / UC Diagnoses   Final diagnoses:  Cervical radiculopathy  Chronic right shoulder pain     Discharge Instructions      Advised to take the Vicodin tablets 5/325, 1-2 every 6-8 hours as needed for acute pain.  (Be cautious this medication as it does cause drowsiness)  Internal referral has been made to [facility] neurology, their office should be calling you within the next 48 hours to arrange an appointment to be seen and evaluated.  The neurologist that you saw in 2014 and evaluated you for right cervical radiculopathy was Dr. Aliene Beams.    ED Prescriptions     Medication Sig Dispense Auth. Provider   HYDROcodone-acetaminophen (NORCO/VICODIN) 5-325 MG tablet Take 1-2 tablets by mouth every 6 (six) hours as needed. 20 tablet Nyoka Lint, PA-C      I have reviewed the PDMP during this encounter.   Nyoka Lint, PA-C 08/25/22 1719

## 2022-08-25 NOTE — Discharge Instructions (Addendum)
Advised to take the Vicodin tablets 5/325, 1-2 every 6-8 hours as needed for acute pain.  (Be cautious this medication as it does cause drowsiness)  Internal referral has been made to [facility] neurology, their office should be calling you within the next 48 hours to arrange an appointment to be seen and evaluated.  The neurologist that you saw in 2014 and evaluated you for right cervical radiculopathy was Dr. Aliene Beams.

## 2022-08-25 NOTE — ED Triage Notes (Signed)
Pt presents  to the office for right shoulder pain. Pt stated he pain is getting worse and would like to be referred.

## 2022-09-06 ENCOUNTER — Other Ambulatory Visit: Payer: Self-pay

## 2022-09-06 ENCOUNTER — Encounter (HOSPITAL_COMMUNITY): Payer: Self-pay | Admitting: Emergency Medicine

## 2022-09-06 ENCOUNTER — Ambulatory Visit (HOSPITAL_COMMUNITY)
Admission: EM | Admit: 2022-09-06 | Discharge: 2022-09-06 | Disposition: A | Discharge status: Home / Self Care | Payer: BC Managed Care – PPO | Attending: Family Medicine | Admitting: Family Medicine

## 2022-09-06 DIAGNOSIS — M62838 Other muscle spasm: Secondary | ICD-10-CM | POA: Diagnosis not present

## 2022-09-06 DIAGNOSIS — I10 Essential (primary) hypertension: Secondary | ICD-10-CM | POA: Diagnosis not present

## 2022-09-06 DIAGNOSIS — M5010 Cervical disc disorder with radiculopathy, unspecified cervical region: Secondary | ICD-10-CM

## 2022-09-06 MED ORDER — AMLODIPINE BESYLATE 10 MG PO TABS: 10.0000 mg | ORAL_TABLET | Freq: Every day | ORAL | 2 refills | Status: DC

## 2022-09-06 MED ORDER — KETOROLAC TROMETHAMINE 30 MG/ML IJ SOLN
30.0000 mg | Freq: Once | INTRAMUSCULAR | Status: AC
  Administered 2022-09-06: 30 mg via INTRAMUSCULAR

## 2022-09-06 MED ORDER — KETOROLAC TROMETHAMINE 30 MG/ML IJ SOLN
INTRAMUSCULAR | Status: AC
  Filled 2022-09-06: qty 1

## 2022-09-06 MED ORDER — TIZANIDINE HCL 4 MG PO TABS: 4.0000 mg | ORAL_TABLET | Freq: Three times a day (TID) | ORAL | 0 refills | Status: DC | PRN

## 2022-09-06 MED ORDER — IBUPROFEN 800 MG PO TABS: 800.0000 mg | ORAL_TABLET | Freq: Three times a day (TID) | ORAL | 0 refills | Status: DC | PRN

## 2022-09-06 NOTE — ED Notes (Signed)
Discharged by clinical staff member

## 2022-09-06 NOTE — Discharge Instructions (Addendum)
You have been given a shot of Toradol 30 mg today.  Take ibuprofen 800 mg--1 tab every 8 hours as needed for pain.   Take tizanidine 4 mg--1 every 8 hours as needed for muscle spasms; this medication can cause dizziness and sleepiness  Restart your amlodipine 10 mg--1 tablet daily for blood pressure  Please make a follow-up appointment with your primary care office.  You should be seen regularly for your hypertension for at least.  They can also see you and evaluate this pain you are having.  Their address and phone number is included in this paperwork.  If you happen to lose this paperwork, you can find their address and phone number by searching their name on the Internet

## 2022-09-06 NOTE — ED Triage Notes (Addendum)
Patient has right shoulder and neck pain for a month or more, feels like a cramp in muscles, spasms.  [facility].    No longer has medicine for this pain.    Patient has been out of all regular medicines for 2 months

## 2022-09-06 NOTE — ED Provider Notes (Signed)
[facility]    CSN: [id number] Arrival date & time: 09/06/22  1313      History   Chief Complaint Chief Complaint  Patient presents with   Torticollis   Shoulder Pain    HPI John Collins is a 62 y.o. male.    Shoulder Pain  Here for continued pain in his right posterior neck and shoulder and in his right upper arm.  He was seen here in late March and was prescribed hydrocodone and contact information was given for neurology.  He states that he had had injections for pain some years ago at this neurology office he was going to see.  He cannot see them till June.  He has not seen his primary care since early 2023.  He states he does not know where they are.  He is aware that they are building with the used to be has been torn down.   He has a history of hypertension and is supposed to be taking amlodipine.  No recent trauma.  He has seen muscle twitching and spasms in his right upper arm and shoulder.  Past Medical History:  Diagnosis Date   Cervical radiculopathy    Hypertension     Patient Active Problem List   Diagnosis Date Noted   Lateral epicondylitis of right elbow 07/31/2018   Mixed hyperlipidemia 03/01/2014   Essential hypertension, benign 11/11/2013    Past Surgical History:  Procedure Laterality Date   COLONOSCOPY WITH PROPOFOL N/A 03/01/2014   Procedure: COLONOSCOPY WITH PROPOFOL;  Surgeon: Garlan Fair, MD;  Location: [facility];  Service: Endoscopy;  Laterality: N/A;       Home Medications    Prior to Admission medications   Medication Sig Start Date End Date Taking? Authorizing Provider  ibuprofen (ADVIL) 800 MG tablet Take 1 tablet (800 mg total) by mouth every 8 (eight) hours as needed (pain). 09/06/22  Yes Avana Kreiser, Gwenlyn Perking, MD  tiZANidine (ZANAFLEX) 4 MG tablet Take 1 tablet (4 mg total) by mouth every 8 (eight) hours as needed for muscle spasms. 09/06/22  Yes Barrett Henle, MD  amLODipine (NORVASC) 10 MG tablet Take 1  tablet (10 mg total) by mouth daily. 09/06/22   Barrett Henle, MD  atorvastatin (LIPITOR) 20 MG tablet TAKE 1 TABLET BY MOUTH EVERY DAY Patient not taking: Reported on 09/06/2022 09/18/21   Charlott Rakes, MD  fluticasone (FLONASE) 50 MCG/ACT nasal spray Place 1 spray into both nostrils daily. Patient not taking: Reported on 06/21/2021 03/25/21   Raspet, Derry Skill, PA-C  metFORMIN (GLUCOPHAGE) 500 MG tablet Take 1 tablet (500 mg total) by mouth 2 (two) times daily with a meal. Patient not taking: Reported on 09/06/2022 06/22/21   Argentina Donovan, PA-C  Misc. Devices MISC CPAP therapy on 11 cm H2O or autopap. Small size Fisher[employer]Paykel Full Face Mask Simplus mask and heated humidification.  Diagnosis obstructive sleep apnea Patient not taking: Reported on 06/21/2021 07/04/20   Charlott Rakes, MD  tamsulosin (FLOMAX) 0.4 MG CAPS capsule Take 1 capsule (0.4 mg total) by mouth daily. Patient not taking: Reported on 09/06/2022 06/27/21   Charlott Rakes, MD  triamcinolone cream (KENALOG) 0.1 % Apply 1 application topically 2 (two) times daily. Patient not taking: Reported on 01/18/2020 09/09/19   Charlott Rakes, MD    Family History Family History  Problem Relation Age of Onset   Thyroid disease Mother     Social History Social History   Tobacco Use   Smoking status: Never  Smokeless tobacco: Never  Vaping Use   Vaping Use: Never used  Substance Use Topics   Alcohol use: Not Currently    Comment: rare to occ. monthly   Drug use: No     Allergies   Lisinopril   Review of Systems Review of Systems   Physical Exam Triage Vital Signs ED Triage Vitals  Enc Vitals Group     BP 09/06/22 1409 (!) 186/102     Pulse Rate 09/06/22 1409 80     Resp 09/06/22 1409 20     Temp 09/06/22 1409 98.8 F (37.1 C)     Temp Source 09/06/22 1409 Oral     SpO2 09/06/22 1409 96 %     Weight --      Height --      Head Circumference --      Peak Flow --      Pain Score 09/06/22 1406 8     Pain Loc  --      Pain Edu? --      Excl. in [place]? --    No data found.  Updated Vital Signs BP (!) 154/80 (BP Location: Right Arm) Comment (BP Location): repositioned  Pulse 80   Temp 98.8 F (37.1 C) (Oral)   Resp 20   SpO2 96%   Visual Acuity Right Eye Distance:   Left Eye Distance:   Bilateral Distance:    Right Eye Near:   Left Eye Near:    Bilateral Near:     Physical Exam Vitals reviewed.  Constitutional:      General: He is not in acute distress.    Appearance: He is not ill-appearing, toxic-appearing or diaphoretic.  Eyes:     Extraocular Movements: Extraocular movements intact.     Conjunctiva/sclera: Conjunctivae normal.     Pupils: Pupils are equal, round, and reactive to light.  Cardiovascular:     Rate and Rhythm: Normal rate and regular rhythm.     Heart sounds: No murmur heard. Pulmonary:     Effort: Pulmonary effort is normal. No respiratory distress.     Breath sounds: Normal breath sounds. No stridor. No wheezing or rales.  Musculoskeletal:     Cervical back: Neck supple.     Comments: There is tenderness of the right trapezius along the posterior shoulder and there is tenderness of the upper arm also.  No rash.  No erythema or deformity.  Muscle strength is normal  Lymphadenopathy:     Cervical: No cervical adenopathy.  Skin:    Coloration: Skin is not jaundiced or pale.  Neurological:     General: No focal deficit present.     Mental Status: He is alert and oriented to person, place, and time.  Psychiatric:        Behavior: Behavior normal.      UC Treatments / Results  Labs (all labs ordered are listed, but only abnormal results are displayed) Labs Reviewed - No data to display  EKG   Radiology No results found.  Procedures Procedures (including critical care time)  Medications Ordered in UC Medications  ketorolac (TORADOL) 30 MG/ML injection 30 mg (has no administration in time range)    Initial Impression / Assessment and Plan / UC  Course  I have reviewed the triage vital signs and the nursing notes.  Pertinent labs & imaging results that were available during my care of the patient were reviewed by me and considered in my medical decision making (see chart for details).  Toradol injection was given here, and anti-inflammatory prescription pain medication is sent in.  I discussed with him that we will not do repeated prescriptions of narcotics from the urgent care here.  I have also discussed with him the importance of follow-up with his primary care and that they can help him with referrals to appropriate clinics to treat his pain. Final Clinical Impressions(s) / UC Diagnoses   Final diagnoses:  Cervical disc disorder with radiculopathy of cervical region  Muscle spasm     Discharge Instructions      You have been given a shot of Toradol 30 mg today.  Take ibuprofen 800 mg--1 tab every 8 hours as needed for pain.   Take tizanidine 4 mg--1 every 8 hours as needed for muscle spasms; this medication can cause dizziness and sleepiness  Restart your amlodipine 10 mg--1 tablet daily for blood pressure  Please make a follow-up appointment with your primary care office.  You should be seen regularly for your hypertension for at least.  They can also see you and evaluate this pain you are having.  Their address and phone number is included in this paperwork.  If you happen to lose this paperwork, you can find their address and phone number by searching their name on the Internet      ED Prescriptions     Medication Sig Dispense Auth. Provider   amLODipine (NORVASC) 10 MG tablet Take 1 tablet (10 mg total) by mouth daily. 30 tablet Barrett Henle, MD   ibuprofen (ADVIL) 800 MG tablet Take 1 tablet (800 mg total) by mouth every 8 (eight) hours as needed (pain). 21 tablet Sheldon Amara, Gwenlyn Perking, MD   tiZANidine (ZANAFLEX) 4 MG tablet Take 1 tablet (4 mg total) by mouth every 8 (eight) hours as needed for  muscle spasms. 15 tablet Taras Rask, Gwenlyn Perking, MD      I have reviewed the PDMP during this encounter.   Barrett Henle, MD 09/06/22 [phone]

## 2022-10-27 ENCOUNTER — Encounter (HOSPITAL_COMMUNITY): Payer: Self-pay | Admitting: Emergency Medicine

## 2022-10-27 ENCOUNTER — Ambulatory Visit (HOSPITAL_COMMUNITY)
Admission: EM | Admit: 2022-10-27 | Discharge: 2022-10-27 | Disposition: A | Discharge status: Home / Self Care | Payer: BC Managed Care – PPO | Attending: Internal Medicine | Admitting: Internal Medicine

## 2022-10-27 DIAGNOSIS — M5010 Cervical disc disorder with radiculopathy, unspecified cervical region: Secondary | ICD-10-CM

## 2022-10-27 DIAGNOSIS — I10 Essential (primary) hypertension: Secondary | ICD-10-CM

## 2022-10-27 MED ORDER — DEXAMETHASONE SODIUM PHOSPHATE 10 MG/ML IJ SOLN
10.0000 mg | Freq: Once | INTRAMUSCULAR | Status: AC
  Administered 2022-10-27: 10 mg via INTRAMUSCULAR

## 2022-10-27 MED ORDER — IBUPROFEN 800 MG PO TABS: 800.0000 mg | ORAL_TABLET | Freq: Three times a day (TID) | ORAL | 0 refills | Status: DC | PRN

## 2022-10-27 MED ORDER — METHOCARBAMOL 500 MG PO TABS: 500.0000 mg | ORAL_TABLET | Freq: Two times a day (BID) | ORAL | 0 refills | Status: DC

## 2022-10-27 MED ORDER — DEXAMETHASONE SODIUM PHOSPHATE 10 MG/ML IJ SOLN
INTRAMUSCULAR | Status: AC
  Filled 2022-10-27: qty 1

## 2022-10-27 NOTE — Discharge Instructions (Addendum)
-   You may start taking ibuprofen 800mg  every 8 hours as needed for pain and inflammation. - You may also take the prescribed muscle relaxer as directed as needed for muscle aches/spasm.  Do not take this medication and drive or drink alcohol as it can make you sleepy.  Mainly use this medicine at nighttime as needed. - Apply heat 20 minutes on then 20 minutes off and perform gentle range of motion exercises to the area of greatest pain to prevent muscle stiffness and provide further pain relief.   Red flag symptoms to watch out for are numbness/tingling to the legs, weakness, loss of bowel/bladder control, and/or worsening pain that does not respond well to medicines. Follow-up with specialist on December 10, 2022.

## 2022-10-27 NOTE — ED Triage Notes (Signed)
Pt c/o right shoulder and neck pain since Thursday night. Reports hx of same symptoms. Denies injury, fall, lifting.  Has an appt with specialist but appt too far and can't wait.  Reports tried medications without relief.

## 2022-10-27 NOTE — ED Provider Notes (Signed)
[facility]    CSN: [id number] Arrival date & time: 10/27/22  1002      History   Chief Complaint Chief Complaint  Patient presents with   Neck Pain   Shoulder Pain    HPI John Collins is a 62 y.o. male.   Patient with history of hypertension and cervical radiculopathy presents to urgent care for evaluation of acute exacerbation of chronic right sided neck pain and spasm that stared 2 days ago. Pain to the right neck and shoulder radiates to the right mid upper arm, can be described as a strong spasm, and is currently rated at a 10/10. Pain is worsened by movement of the neck and the shoulder. No recent trauma/injuries to the areas of greatest tenderness. Denies midline neck pain, headache, rash to the overlying area of pain, dizziness, fever, chills, viral URI symptoms, and history of surgeries to the spine. Pain does not radiate down the spine. He has been taking ibuprofen over the counter for pain and states this helps a little bit.  Denies recent steroid use. He has an appointment with a specialist on December 10, 2022.    Neck Pain Shoulder Pain Associated symptoms: neck pain     Past Medical History:  Diagnosis Date   Cervical radiculopathy    Hypertension     Patient Active Problem List   Diagnosis Date Noted   Lateral epicondylitis of right elbow 07/31/2018   Mixed hyperlipidemia 03/01/2014   Essential hypertension, benign 11/11/2013    Past Surgical History:  Procedure Laterality Date   COLONOSCOPY WITH PROPOFOL N/A 03/01/2014   Procedure: COLONOSCOPY WITH PROPOFOL;  Surgeon: Charolett Bumpers, MD;  Location: [facility];  Service: Endoscopy;  Laterality: N/A;       Home Medications    Prior to Admission medications   Medication Sig Start Date End Date Taking? Authorizing Provider  methocarbamol (ROBAXIN) 500 MG tablet Take 1 tablet (500 mg total) by mouth 2 (two) times daily. 10/27/22  Yes Carlisle Beers, FNP  amLODipine (NORVASC) 10 MG  tablet Take 1 tablet (10 mg total) by mouth daily. 09/06/22   Zenia Resides, MD  atorvastatin (LIPITOR) 20 MG tablet TAKE 1 TABLET BY MOUTH EVERY DAY Patient not taking: Reported on 09/06/2022 09/18/21   Hoy Register, MD  fluticasone (FLONASE) 50 MCG/ACT nasal spray Place 1 spray into both nostrils daily. Patient not taking: Reported on 06/21/2021 03/25/21   Raspet, Noberto Retort, PA-C  ibuprofen (ADVIL) 800 MG tablet Take 1 tablet (800 mg total) by mouth every 8 (eight) hours as needed (pain). 10/27/22   Carlisle Beers, FNP  metFORMIN (GLUCOPHAGE) 500 MG tablet Take 1 tablet (500 mg total) by mouth 2 (two) times daily with a meal. Patient not taking: Reported on 09/06/2022 06/22/21   Anders Simmonds, PA-C  Misc. Devices MISC CPAP therapy on 11 cm H2O or autopap. Small size [employer]kel Full Face Mask Simplus mask and heated humidification.  Diagnosis obstructive sleep apnea Patient not taking: Reported on 06/21/2021 07/04/20   Hoy Register, MD  tamsulosin (FLOMAX) 0.4 MG CAPS capsule Take 1 capsule (0.4 mg total) by mouth daily. Patient not taking: Reported on 09/06/2022 06/27/21   Hoy Register, MD  tiZANidine (ZANAFLEX) 4 MG tablet Take 1 tablet (4 mg total) by mouth every 8 (eight) hours as needed for muscle spasms. 09/06/22   Zenia Resides, MD  triamcinolone cream (KENALOG) 0.1 % Apply 1 application topically 2 (two) times daily. Patient not taking: Reported on  01/18/2020 09/09/19   Hoy Register, MD    Family History Family History  Problem Relation Age of Onset   Thyroid disease Mother     Social History Social History   Tobacco Use   Smoking status: Never   Smokeless tobacco: Never  Vaping Use   Vaping Use: Never used  Substance Use Topics   Alcohol use: Not Currently    Comment: rare to occ. monthly   Drug use: No     Allergies   Lisinopril   Review of Systems Review of Systems  Musculoskeletal:  Positive for neck pain.  Per HPI   Physical Exam Triage  Vital Signs ED Triage Vitals  Enc Vitals Group     BP 10/27/22 1015 (!) 205/136     Pulse Rate 10/27/22 1015 93     Resp 10/27/22 1015 20     Temp 10/27/22 1015 99 F (37.2 C)     Temp Source 10/27/22 1015 Oral     SpO2 10/27/22 1015 97 %     Weight --      Height --      Head Circumference --      Peak Flow --      Pain Score 10/27/22 1014 10     Pain Loc --      Pain Edu? --      Excl. in [place]? --    No data found.  Updated Vital Signs BP (!) 168/97 (BP Location: Left Arm)   Pulse 93   Temp 99 F (37.2 C) (Oral)   Resp 20   SpO2 97%   Visual Acuity Right Eye Distance:   Left Eye Distance:   Bilateral Distance:    Right Eye Near:   Left Eye Near:    Bilateral Near:     Physical Exam Vitals and nursing note reviewed.  Constitutional:      Appearance: He is not ill-appearing or toxic-appearing.  HENT:     Head: Normocephalic and atraumatic.     Right Ear: Hearing, tympanic membrane, ear canal and external ear normal.     Left Ear: Hearing, tympanic membrane, ear canal and external ear normal.     Nose: Nose normal.     Mouth/Throat:     Lips: Pink.     Mouth: Mucous membranes are moist. No injury.     Tongue: No lesions. Tongue does not deviate from midline.     Palate: No mass and lesions.     Pharynx: Oropharynx is clear. Uvula midline. No pharyngeal swelling, oropharyngeal exudate, posterior oropharyngeal erythema or uvula swelling.     Tonsils: No tonsillar exudate or tonsillar abscesses.  Eyes:     General: Lids are normal. Vision grossly intact. Gaze aligned appropriately.     Extraocular Movements: Extraocular movements intact.     Conjunctiva/sclera: Conjunctivae normal.  Cardiovascular:     Rate and Rhythm: Normal rate and regular rhythm.     Heart sounds: Normal heart sounds, S1 normal and S2 normal.  Pulmonary:     Effort: Pulmonary effort is normal. No respiratory distress.     Breath sounds: Normal breath sounds and air entry.   Musculoskeletal:     Right shoulder: Tenderness present. No swelling, deformity, effusion, laceration, bony tenderness or crepitus. Decreased range of motion (secondary to pain). Normal strength. Normal pulse.     Left shoulder: Normal.     Cervical back: Neck supple. Tenderness present. No edema, erythema, signs of trauma, rigidity, torticollis or crepitus. Pain  with movement and muscular tenderness (tender over multiple points of palpation to the right trapezius muscle) present. No spinous process tenderness. Decreased range of motion (minimally decreased ROM secondary to pain).  Lymphadenopathy:     Cervical: No cervical adenopathy.  Skin:    General: Skin is warm and dry.     Capillary Refill: Capillary refill takes less than 2 seconds.     Findings: No rash.  Neurological:     General: No focal deficit present.     Mental Status: He is alert and oriented to person, place, and time. Mental status is at baseline.     Cranial Nerves: No dysarthria or facial asymmetry.  Psychiatric:        Mood and Affect: Mood normal.        Speech: Speech normal.        Behavior: Behavior normal.        Thought Content: Thought content normal.        Judgment: Judgment normal.      UC Treatments / Results  Labs (all labs ordered are listed, but only abnormal results are displayed) Labs Reviewed - No data to display  EKG   Radiology No results found.  Procedures Procedures (including critical care time)  Medications Ordered in UC Medications  dexamethasone (DECADRON) injection 10 mg (10 mg Intramuscular Given 10/27/22 1027)    Initial Impression / Assessment and Plan / UC Course  I have reviewed the triage vital signs and the nursing notes.  Pertinent labs & imaging results that were available during my care of the patient were reviewed by me and considered in my medical decision making (see chart for details).   1. Cervical disc disorder with radiculopathy of the cervical  region Presentation consistent with acute exacerbation of pain related to patient's chronic cervical radiculopathy. He is neurovascularly intact distally to pain/injury. Musculoskeletal exam stable and low suspicion for acute bony abnormality given atraumatic mechanism of pain/injury, therefore deferred imaging of the cervical spine and the right shoulder. Ibuprofen intermittently has been working for him at home. Will provide a one time dose of steroid (dexamethasone 10mg  IM) in clinic to reduce acute pain and inflammation, then may start ibuprofen 800mg  every 8 hours as needed for pain and inflammation with food. Robaxin muscle relaxer may be used every 12 hours as needed for muscle spasm, drowsiness precautions discussed. Heat and gentle ROM exercises recommended. Follow-up with specialist on December 10, 2022 as scheduled and return sooner if new or worsening symptoms.   2. Essential hypertension BP in triage 205/136. Patient takes amlodipine 10mg  and has not taken this medication this morning. On re-check, BP improved and reduced to 168/97. Advised to take BP medication daily and follow-up with PCP for ongoing management of HTN. Encouraged exercise and low salt diet to improve elevated BP as well. No red flag signs/symptoms indicating need for referral to ED currently related to both hypertension/cervical radiculopathy. Strict ER return precautions discussed. Elevated BP likely secondary to pain.  Discussed physical exam and available lab work findings in clinic with patient.  Counseled patient regarding appropriate use of medications and potential side effects for all medications recommended or prescribed today. Discussed red flag signs and symptoms of worsening condition,when to call the PCP office, return to urgent care, and when to seek higher level of care in the emergency department. Patient verbalizes understanding and agreement with plan. All questions answered. Patient discharged in stable  condition.    Final Clinical Impressions(s) / UC Diagnoses  Final diagnoses:  Cervical disc disorder with radiculopathy of cervical region     Discharge Instructions      - You may start taking ibuprofen 800mg  every 8 hours as needed for pain and inflammation. - You may also take the prescribed muscle relaxer as directed as needed for muscle aches/spasm.  Do not take this medication and drive or drink alcohol as it can make you sleepy.  Mainly use this medicine at nighttime as needed. - Apply heat 20 minutes on then 20 minutes off and perform gentle range of motion exercises to the area of greatest pain to prevent muscle stiffness and provide further pain relief.   Red flag symptoms to watch out for are numbness/tingling to the legs, weakness, loss of bowel/bladder control, and/or worsening pain that does not respond well to medicines. Follow-up with specialist on December 10, 2022.        ED Prescriptions     Medication Sig Dispense Auth. Provider   methocarbamol (ROBAXIN) 500 MG tablet Take 1 tablet (500 mg total) by mouth 2 (two) times daily. 20 tablet Reita May M, FNP   ibuprofen (ADVIL) 800 MG tablet Take 1 tablet (800 mg total) by mouth every 8 (eight) hours as needed (pain). 21 tablet Carlisle Beers, FNP      PDMP not reviewed this encounter.   Reita May Phillips, [place] 11/01/22 [phone]

## 2022-11-09 ENCOUNTER — Ambulatory Visit: Payer: BC Managed Care – PPO | Attending: Internal Medicine | Admitting: Internal Medicine

## 2022-11-09 ENCOUNTER — Encounter: Payer: Self-pay | Admitting: Internal Medicine

## 2022-11-09 VITALS — BP 130/84 | HR 67 | Temp 98.9°F | Ht 68.0 in | Wt 197.0 lb

## 2022-11-09 DIAGNOSIS — R739 Hyperglycemia, unspecified: Secondary | ICD-10-CM

## 2022-11-09 DIAGNOSIS — M5412 Radiculopathy, cervical region: Secondary | ICD-10-CM

## 2022-11-09 DIAGNOSIS — I10 Essential (primary) hypertension: Secondary | ICD-10-CM | POA: Diagnosis not present

## 2022-11-09 DIAGNOSIS — E782 Mixed hyperlipidemia: Secondary | ICD-10-CM

## 2022-11-09 MED ORDER — ATORVASTATIN CALCIUM 20 MG PO TABS: 20.0000 mg | ORAL_TABLET | Freq: Every day | ORAL | 1 refills | Status: DC

## 2022-11-09 MED ORDER — METHOCARBAMOL 500 MG PO TABS: 500.0000 mg | ORAL_TABLET | Freq: Two times a day (BID) | ORAL | 0 refills | Status: DC | PRN

## 2022-11-09 MED ORDER — AMLODIPINE BESYLATE 10 MG PO TABS: 10.0000 mg | ORAL_TABLET | Freq: Every day | ORAL | 1 refills | Status: DC

## 2022-11-09 MED ORDER — GABAPENTIN 300 MG PO CAPS: 300.0000 mg | ORAL_CAPSULE | Freq: Every day | ORAL | 1 refills | Status: DC

## 2022-11-09 MED ORDER — IBUPROFEN 800 MG PO TABS: 800.0000 mg | ORAL_TABLET | Freq: Two times a day (BID) | ORAL | 0 refills | Status: DC | PRN

## 2022-11-09 NOTE — Progress Notes (Signed)
Patient ID: John Collins, male    DOB: [birth year]  MRN: [id number]  CC: Neck Pain (Neck & shoulder pain x62mo has worsened. Med refill - Methocarbamol, amlodipine, atorvastatin/Pain radiating from neck to L & R shoulder, R arm)   Subjective: John Collins is a 62 y.o. male who presents for UC visit PCP is Dr. Alvis Lemmings. His concerns today include:  Pt with HTN, HL, BPH, chronic neck/shoulder pain  Pt c/o Rt sided neck and shoulder pain x 3.  No initiating factors other than he works on a computer a lot using a mouse. Dx with cervical radiculopathy in 2014 and had inj to neck through [facility] Dr. Terrace Arabia.  Did well until March of this yr when symptoms returned. Started with burning, tingling and pain  in RT upper arm then went over shoulder into the neck.  + muscle tightening in RT upper arm Daily and constant and now radiates to RT forearm.   Some noticeable weakness in RT arm compared to LT.  Interfers with work productivity and sleep.  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  Works on Anim[employer] and does a lot of writing  Seen in the emergency room 1 time and at urgent care 3 times since March due to his symptoms.  Last seen at urgent care 10/27/2022.  He was given a dexamethasone shot IM and discharged on methocarbamol and ibuprofen. Patient reports that the medications help but pain and burning waxes and wanes.  Most of the times, pain is 10/10.  Today it is 7-8/10.  When he takes the ibuprofen and muscle relaxant, it knocks it down to 3/10.  Out of ibuprofen since earlier this week.  Request refill on muscle relaxant.  HTN: He is on amlodipine 10 mg daily.  Took it already today.  Requests refills.  No device to check blood pressure.  He also requests refill on atorvastatin which he was on for high cholesterol but has been out of it for several months.   Patient Active Problem List   Diagnosis Date Noted   Lateral epicondylitis of right elbow 07/31/2018   Mixed hyperlipidemia  03/01/2014   Essential hypertension, benign 11/11/2013     Current Outpatient Medications on File Prior to Visit  Medication Sig Dispense Refill   fluticasone (FLONASE) 50 MCG/ACT nasal spray Place 1 spray into both nostrils daily. (Patient not taking: Reported on 06/21/2021) 16 g 0   metFORMIN (GLUCOPHAGE) 500 MG tablet Take 1 tablet (500 mg total) by mouth 2 (two) times daily with a meal. (Patient not taking: Reported on 09/06/2022) 180 tablet 3   Misc. Devices MISC CPAP therapy on 11 cm H2O or autopap. Small size Fisher[employer]Paykel Full Face Mask Simplus mask and heated humidification.  Diagnosis obstructive sleep apnea (Patient not taking: Reported on 06/21/2021) 1 each 0   tamsulosin (FLOMAX) 0.4 MG CAPS capsule Take 1 capsule (0.4 mg total) by mouth daily. (Patient not taking: Reported on 09/06/2022) 30 capsule 6   triamcinolone cream (KENALOG) 0.1 % Apply 1 application topically 2 (two) times daily. (Patient not taking: Reported on 01/18/2020) 30 g 1   No current facility-administered medications on file prior to visit.    Allergies  Allergen Reactions   Lisinopril     Cough     Social History   Socioeconomic History   Marital status: Married    Spouse name: Not on file   Number of children: Not on file   Years of education: Not on file  Highest education level: Not on file  Occupational History   Not on file  Tobacco Use   Smoking status: Never   Smokeless tobacco: Never  Vaping Use   Vaping Use: Never used  Substance and Sexual Activity   Alcohol use: Not Currently    Comment: rare to occ. monthly   Drug use: No   Sexual activity: Not on file  Other Topics Concern   Not on file  Social History Narrative   He lives with 2 daughter, native of [place], immigrate to [place] 13 years ago, does not work now.   Social Determinants of Health   Financial Resource Strain: Not on file  Food Insecurity: Not on file  Transportation Needs: Not on file  Physical Activity: Not on file   Stress: Not on file  Social Connections: Not on file  Intimate Partner Violence: Not on file    Family History  Problem Relation Age of Onset   Thyroid disease Mother     Past Surgical History:  Procedure Laterality Date   COLONOSCOPY WITH PROPOFOL N/A 03/01/2014   Procedure: COLONOSCOPY WITH PROPOFOL;  Surgeon: Charolett Bumpers, MD;  Location: [facility];  Service: Endoscopy;  Laterality: N/A;    ROS: Review of Systems Negative except as stated above  PHYSICAL EXAM: BP 130/84   Pulse 67   Temp 98.9 F (37.2 C) (Oral)   Ht 5\' 8"  (1.727 m)   Wt 197 lb (89.4 kg)   SpO2 99%   BMI 29.95 kg/m   Physical Exam   General appearance - alert, well appearing, and in no distress Mental status - normal mood, behavior, speech, dress, motor activity, and thought processes Neurological -gross sensation intact in both upper extremities proximally and distally.  Grip 5/5 bilaterally.  Power 5/5 proximally and distally in both upper extremities. MSK: No tenderness on palpation of the cervical spine.  Mild discomfort with flexion of the neck.  Mild discomfort with passive rotation of the neck.    Latest Ref Rng & Units 06/21/2021    9:44 AM 09/09/2019    4:20 PM 12/17/2017    9:31 AM  CMP  Glucose 70 - 99 mg/dL 829  562  130   BUN 8 - 27 mg/dL 8  13  8    Creatinine 0.76 - 1.27 mg/dL 8.65  7.84  6.96   Sodium 134 - 144 mmol/L 141  143  142   Potassium 3.5 - 5.2 mmol/L 4.6  4.1  4.3   Chloride 96 - 106 mmol/L 104  106  103   CO2 20 - 29 mmol/L 23  25  21    Calcium 8.6 - 10.2 mg/dL 9.1  9.5  9.1   Total Protein 6.0 - 8.5 g/dL 7.3  7.3  7.1   Total Bilirubin 0.0 - 1.2 mg/dL 0.3  0.5  0.5   Alkaline Phos 44 - 121 IU/L 139  80  91   AST 0 - 40 IU/L 15  19  21    ALT 0 - 44 IU/L 17  20  20     Lipid Panel     Component Value Date/Time   CHOL 182 06/21/2021 0944   TRIG 84 06/21/2021 0944   HDL 48 06/21/2021 0944   CHOLHDL 3.8 06/21/2021 0944   CHOLHDL 4.2 11/11/2013 1623   VLDL 18  11/11/2013 1623   LDLCALC 118 (H) 06/21/2021 0944    CBC    Component Value Date/Time   WBC 4.9 06/21/2021 0944   WBC  5.8 11/11/2013 1623   RBC 4.72 06/21/2021 0944   RBC 4.41 11/11/2013 1623   HGB 13.5 06/21/2021 0944   HCT 42.1 06/21/2021 0944   PLT 251 06/21/2021 0944   MCV 89 06/21/2021 0944   MCH 28.6 06/21/2021 0944   MCH 28.8 11/11/2013 1623   MCHC 32.1 06/21/2021 0944   MCHC 33.8 11/11/2013 1623   RDW 11.8 06/21/2021 0944   LYMPHSABS 2.0 06/21/2021 0944   MONOABS 0.5 11/11/2013 1623   EOSABS 0.1 06/21/2021 0944   BASOSABS 0.0 06/21/2021 0944    ASSESSMENT AND PLAN:  1. Cervical radiculopathy -Most likely due to pinched nerve. Advised to avoid any excessive lifting, pushing or pulling. Refill given on ibuprofen and Robaxin. Will give a trial of gabapentin to take at bedtime. Will have him follow-up with his PCP in several weeks as he most likely will need advanced imaging if no resolution. He has also made an appointment with [facility] neurology to see Dr. Debarah Crape next month. - DG Cervical Spine Complete; Future - ibuprofen (ADVIL) 800 MG tablet; Take 1 tablet (800 mg total) by mouth 2 (two) times daily as needed (pain).  Dispense: 60 tablet; Refill: 0 - methocarbamol (ROBAXIN) 500 MG tablet; Take 1 tablet (500 mg total) by mouth 2 (two) times daily as needed for muscle spasms.  Dispense: 60 tablet; Refill: 0 - gabapentin (NEURONTIN) 300 MG capsule; Take 1 capsule (300 mg total) by mouth at bedtime.  Dispense: 30 capsule; Refill: 1  2. Essential hypertension Repeat blood pressure closer to goal.  Continue amlodipine 10 mg.  Refill sent - amLODipine (NORVASC) 10 MG tablet; Take 1 tablet (10 mg total) by mouth daily.  Dispense: 30 tablet; Refill: 1 - CBC - Comprehensive metabolic panel  3. Mixed hyperlipidemia - Lipid panel - atorvastatin (LIPITOR) 20 MG tablet; Take 1 tablet (20 mg total) by mouth daily.  Dispense: 30 tablet; Refill: 1   Patient was given the  opportunity to ask questions.  Patient verbalized understanding of the plan and was able to repeat key elements of the plan.  Addendum 11/12/22: pt with hyperglycemia on blood test.  Will add A1C.  This documentation was completed using Paediatric nurse.  Any transcriptional errors are unintentional.  Orders Placed This Encounter  Procedures   DG Cervical Spine Complete   CBC   Comprehensive metabolic panel   Lipid panel     Requested Prescriptions   Signed Prescriptions Disp Refills   ibuprofen (ADVIL) 800 MG tablet 60 tablet 0    Sig: Take 1 tablet (800 mg total) by mouth 2 (two) times daily as needed (pain).   amLODipine (NORVASC) 10 MG tablet 30 tablet 1    Sig: Take 1 tablet (10 mg total) by mouth daily.   atorvastatin (LIPITOR) 20 MG tablet 30 tablet 1    Sig: Take 1 tablet (20 mg total) by mouth daily.   methocarbamol (ROBAXIN) 500 MG tablet 60 tablet 0    Sig: Take 1 tablet (500 mg total) by mouth 2 (two) times daily as needed for muscle spasms.   gabapentin (NEURONTIN) 300 MG capsule 30 capsule 1    Sig: Take 1 capsule (300 mg total) by mouth at bedtime.    Return in about 1 month (around 12/09/2022) for Give f/u in 1 mth with Dr. Alvis Lemmings.  Jonah Blue, MD, FACP

## 2022-11-10 LAB — LIPID PANEL
Chol/HDL Ratio: 5.7 ratio — ABNORMAL HIGH (ref 0.0–5.0)
Cholesterol, Total: 285 mg/dL — ABNORMAL HIGH (ref 100–199)
HDL: 50 mg/dL (ref >39)
LDL Chol Calc (NIH): 202 mg/dL — ABNORMAL HIGH (ref 0–99)
Triglycerides: 174 mg/dL — ABNORMAL HIGH (ref 0–149)
VLDL Cholesterol Cal: 33 mg/dL (ref 5–40)

## 2022-11-10 LAB — COMPREHENSIVE METABOLIC PANEL
ALT: 29 IU/L (ref 0–44)
AST: 38 IU/L (ref 0–40)
Albumin/Globulin Ratio: 1.6 (ref 1.2–2.2)
Albumin: 4.5 g/dL (ref 3.9–4.9)
Alkaline Phosphatase: 96 IU/L (ref 44–121)
BUN/Creatinine Ratio: 15 (ref 10–24)
BUN: 13 mg/dL (ref 8–27)
Bilirubin Total: 0.4 mg/dL (ref 0.0–1.2)
CO2: 26 mmol/L (ref 20–29)
Calcium: 9.4 mg/dL (ref 8.6–10.2)
Chloride: 104 mmol/L (ref 96–106)
Creatinine, Ser: 0.85 mg/dL (ref 0.76–1.27)
Globulin, Total: 2.9 g/dL (ref 1.5–4.5)
Glucose: 150 mg/dL — ABNORMAL HIGH (ref 70–99)
Potassium: 4.3 mmol/L (ref 3.5–5.2)
Sodium: 140 mmol/L (ref 134–144)
Total Protein: 7.4 g/dL (ref 6.0–8.5)
eGFR: 99 mL/min/{1.73_m2} (ref >59)

## 2022-11-10 LAB — CBC
Hematocrit: 37.9 % (ref 37.5–51.0)
Hemoglobin: 12.6 g/dL — ABNORMAL LOW (ref 13.0–17.7)
MCH: 29.6 pg (ref 26.6–33.0)
MCHC: 33.2 g/dL (ref 31.5–35.7)
MCV: 89 fL (ref 79–97)
Platelets: 242 10*3/uL (ref 150–450)
RBC: 4.26 x10E6/uL (ref 4.14–5.80)
RDW: 12 % (ref 11.6–15.4)
WBC: 5.6 10*3/uL (ref 3.4–10.8)

## 2022-11-12 NOTE — Addendum Note (Signed)
Addended by: Jonah Blue B on: 11/12/2022 09:45 PM   Modules accepted: Orders

## 2022-11-15 ENCOUNTER — Other Ambulatory Visit: Payer: Self-pay | Admitting: Internal Medicine

## 2022-11-15 DIAGNOSIS — R7303 Prediabetes: Secondary | ICD-10-CM

## 2022-11-15 MED ORDER — METFORMIN HCL 500 MG PO TABS: 500.0000 mg | ORAL_TABLET | Freq: Every day | ORAL | 1 refills | Status: DC

## 2022-11-22 LAB — SPECIMEN STATUS REPORT

## 2022-11-22 LAB — HGB A1C W/O EAG: Hgb A1c MFr Bld: 8.3 % — ABNORMAL HIGH (ref 4.8–5.6)

## 2022-11-23 ENCOUNTER — Encounter: Payer: Self-pay | Admitting: Pharmacist

## 2022-11-23 ENCOUNTER — Ambulatory Visit: Payer: BC Managed Care – PPO | Attending: Family Medicine | Admitting: Pharmacist

## 2022-11-23 DIAGNOSIS — Z7984 Long term (current) use of oral hypoglycemic drugs: Secondary | ICD-10-CM

## 2022-11-23 DIAGNOSIS — E119 Type 2 diabetes mellitus without complications: Secondary | ICD-10-CM | POA: Diagnosis not present

## 2022-11-23 MED ORDER — ONETOUCH VERIO W/DEVICE KIT: PACK | 0 refills | Status: DC

## 2022-11-23 MED ORDER — ONETOUCH VERIO VI STRP: ORAL_STRIP | 2 refills | Status: DC

## 2022-11-23 MED ORDER — METFORMIN HCL 500 MG PO TABS: 500.0000 mg | ORAL_TABLET | Freq: Two times a day (BID) | ORAL | 1 refills | Status: DC

## 2022-11-23 MED ORDER — ONETOUCH DELICA PLUS LANCET33G MISC: 2 refills | Status: DC

## 2022-11-23 NOTE — Progress Notes (Signed)
S:     No chief complaint on file.  62 y.o. male who presents for diabetes evaluation, education, and management.  PMH is significant for recently dx w/ T2DM ~2 weeks ago (previously preDM), HTN, and mixed hyperlipidemia. Patient was referred and last seen by Dr. Laural Benes on 11/09/2022. Blood work at that visit showed an elevated FPG. A1c confirmed dx of DM. Pt was started on metformin and referred to me for DM teaching.   Today, patient arrives in good spirits and presents without any assistance. DM is newly dx'd but he does have a hx of preDM. He has never been hospitalized for DM or pancreatitis. Has never used insulin IP or OP. Just started his metformin at 500 mg daily and is tolerating this well. Otherwise, he has no personal hx of ASCVD, CKD, or CHF. No thyroid cancer in him or his family.   Family/Social History:  Fhx: thyroid disease in mother, otherwise no pertinent positives  Tobacco: never smoker Alcohol: rarely on special occasions   Current diabetes medications include: metformin 500 mg daily  Current hypertension medications include: amlodipine 10 mg daily  Current hyperlipidemia medications include: atorvastatin 20 mg daily   Patient reports adherence to taking all medications as prescribed.   Insurance coverage: BCBS  Patient denies hypoglycemic events.  Reported home fasting blood sugars: not checking, no meter  Reported 2 hour post-meal/random blood sugars: not checking, no meter.  Patient denies nocturia (nighttime urination).  Patient denies neuropathy (nerve pain). Patient denies visual changes. Patient reports self foot exams.   Patient reported dietary habits: Does not currently limit carbohydrates or starches.  Snacks: usually tropical or citrus fruits  Drinks: not much outside of water  Patient-reported exercise habits: none currently    O:  Lab Results  Component Value Date   HGBA1C 8.3 (H) 11/09/2022   There were no vitals filed for this  visit.  Lipid Panel     Component Value Date/Time   CHOL 285 (H) 11/09/2022 1516   TRIG 174 (H) 11/09/2022 1516   HDL 50 11/09/2022 1516   CHOLHDL 5.7 (H) 11/09/2022 1516   CHOLHDL 4.2 11/11/2013 1623   VLDL 18 11/11/2013 1623   LDLCALC 202 (H) 11/09/2022 1516    Clinical Atherosclerotic Cardiovascular Disease (ASCVD): No  The 10-year ASCVD risk score (Arnett DK, et al., 2019) is: 15%   Values used to calculate the score:     Age: 62 years     Sex: Male     Is Non-Hispanic African American: No     Diabetic: No     Tobacco smoker: No     Systolic Blood Pressure: 130 mmHg     Is BP treated: Yes     HDL Cholesterol: 50 mg/dL     Total Cholesterol: 285 mg/dL   Patient is participating in a Managed Medicaid Plan: No   A/P: Diabetes newly dx'd two weeks ago, currently uncontrolled based on A1c. Patient is able to verbalize appropriate hypoglycemia management plan. Medication adherence appears appropriate. He is tolerating his metformin so I will have him increase this to BID. I have also ordered GM supplies for home monitoring. We talked extensively about dietary modification and exercise today. He seems motivated to control his DM.  -Increased dose of metformin to 500 mg BID. We can increase further to 1000 mg BID if he tolerates this.  -Started OneTouch Verio supplies. Counseled on home glucose goals.  -Patient educated on purpose, proper use, and potential adverse effects  of metformin.  -Extensively discussed pathophysiology of diabetes, recommended lifestyle interventions, dietary effects on blood sugar control.  -Counseled on s/sx of and management of hypoglycemia.  -Next A1c anticipated 02/2023.   ASCVD risk - primary prevention in patient with diabetes. Last LDL is >190. We likely need high intensity statin therapy at this point. Priority given to DM today. Will discuss HLD at follow-up. -Continued atorvastatin 20 mg daily for now.   Written patient instructions provided.  Patient verbalized understanding of treatment plan.  Total time in face to face counseling 30 minutes.    Follow-up:  Pharmacist in 1 month.  Butch Penny, PharmD, Patsy [place], CPP Clinical Pharmacist [facility] [phone]

## 2022-12-01 ENCOUNTER — Other Ambulatory Visit: Payer: Self-pay | Admitting: Internal Medicine

## 2022-12-01 DIAGNOSIS — E782 Mixed hyperlipidemia: Secondary | ICD-10-CM

## 2022-12-03 NOTE — Telephone Encounter (Signed)
Requested medication (s) are due for refill today - no  Requested medication (s) are on the active medication list -yes  Future visit scheduled -yes  Last refill: 11/09/22 #30 1RF  Notes to clinic: new start Rx- original Rx does not include #90- sent for review of request.  Requested Prescriptions  Pending Prescriptions Disp Refills   atorvastatin (LIPITOR) 20 MG tablet [Pharmacy Med Name: ATORVASTATIN 20 MG TABLET] 90 tablet 1    Sig: TAKE 1 TABLET BY MOUTH EVERY DAY     Cardiovascular:  Antilipid - Statins Failed - 12/01/2022  1:31 PM      Failed - Lipid Panel in normal range within the last 12 months    Cholesterol, Total  Date Value Ref Range Status  11/09/2022 285 (H) 100 - 199 mg/dL Final   LDL Chol Calc (NIH)  Date Value Ref Range Status  11/09/2022 202 (H) 0 - 99 mg/dL Final   HDL  Date Value Ref Range Status  11/09/2022 50 >39 mg/dL Final   Triglycerides  Date Value Ref Range Status  11/09/2022 174 (H) 0 - 149 mg/dL Final         Passed - Patient is not pregnant      Passed - Valid encounter within last 12 months    Recent Outpatient Visits           1 week ago Type 2 diabetes mellitus without complication, without long-term current use of insulin (HCC)   [place] [facility] [place], [place] L, RPH-CPP   3 weeks ago Cervical radiculopathy   [place] [facility] Jonah Blue B, MD   1 year ago Benign prostatic hyperplasia with incomplete bladder emptying   [place] [facility] [place], Odette Horns, MD   1 year ago Screening for prostate cancer   [facility] [place], [place], [place]   2 years ago Suspected sleep apnea   [facility] [place], Odette Horns, MD       Future Appointments             In 3 weeks Hoy Register, MD [facility]               Requested  Prescriptions  Pending Prescriptions Disp Refills   atorvastatin (LIPITOR) 20 MG tablet [Pharmacy Med Name: ATORVASTATIN 20 MG TABLET] 90 tablet 1    Sig: TAKE 1 TABLET BY MOUTH EVERY DAY     Cardiovascular:  Antilipid - Statins Failed - 12/01/2022  1:31 PM      Failed - Lipid Panel in normal range within the last 12 months    Cholesterol, Total  Date Value Ref Range Status  11/09/2022 285 (H) 100 - 199 mg/dL Final   LDL Chol Calc (NIH)  Date Value Ref Range Status  11/09/2022 202 (H) 0 - 99 mg/dL Final   HDL  Date Value Ref Range Status  11/09/2022 50 >39 mg/dL Final   Triglycerides  Date Value Ref Range Status  11/09/2022 174 (H) 0 - 149 mg/dL Final         Passed - Patient is not pregnant      Passed - Valid encounter within last 12 months    Recent Outpatient Visits           1 week ago Type 2 diabetes mellitus without complication, without long-term current use of insulin (HCC)  [facility] Lois Huxley, Cornelius Moras, RPH-CPP   3 weeks ago Cervical radiculopathy   [facility] Marcine Matar, MD   1 year ago Benign prostatic hyperplasia with incomplete bladder emptying   [facility] [place] Hoy Register, MD   1 year ago Screening for prostate cancer   [facility] [place], [place], [place]   2 years ago Suspected sleep apnea   [facility] Hoy Register, MD       Future Appointments             In 3 weeks Hoy Register, MD [facility]

## 2022-12-06 ENCOUNTER — Other Ambulatory Visit: Payer: Self-pay | Admitting: Internal Medicine

## 2022-12-06 DIAGNOSIS — M5412 Radiculopathy, cervical region: Secondary | ICD-10-CM

## 2022-12-11 ENCOUNTER — Encounter: Payer: Self-pay | Admitting: Neurology

## 2022-12-11 ENCOUNTER — Ambulatory Visit (INDEPENDENT_AMBULATORY_CARE_PROVIDER_SITE_OTHER): Payer: BC Managed Care – PPO | Admitting: Neurology

## 2022-12-11 VITALS — BP 136/78 | HR 72 | Ht 68.0 in | Wt 194.5 lb

## 2022-12-11 DIAGNOSIS — M5412 Radiculopathy, cervical region: Secondary | ICD-10-CM

## 2022-12-11 MED ORDER — DULOXETINE HCL 60 MG PO CPEP: 60.0000 mg | ORAL_CAPSULE | Freq: Every day | ORAL | 6 refills | Status: DC

## 2022-12-11 MED ORDER — GABAPENTIN 300 MG PO CAPS: 900.0000 mg | ORAL_CAPSULE | Freq: Every day | ORAL | 11 refills | Status: AC

## 2022-12-11 NOTE — Progress Notes (Unsigned)
Chief Complaint  Patient presents with   New Patient (Initial Visit)    Rm15, wife and son present, cervical radiculopathy:tingly right shoulder, arm, left shoulder, and neck pain. He would like to reestablish care      ASSESSMENT AND PLAN  John Collins is a 62 y.o. 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   MEDICAL HISTORY:  John Collins, is a 62 year old male seen in request by Dr. Hoy Register for evaluation of worsening neck pain, radiating pain to right shoulder,  I reviewed and summarized the referring note. PMHX DM HLD HTN  He now works a Health and safety inspector job, complains of worsening neck pain, radiating pain to right shoulder, arm, right hand, muscle spasm, getting worse since beginning of 2024 to the point of difficulty for him to continue his job, difficulty sleeping, because of the constant pain,   He also complains of muscle spasm seems to move the left shoulder some, denies gait abnormality, denies bowel or bladder incontinence  I saw him in 2014 for similar complaints, MRI in April 2014, describes C5-6, right uncovertebral bone spur disc projection with severe right foraminal stenosis, potential impingement upon right C6 nerve roots, also mild left foraminal stenosis, no evidence of canal stenosis  PHYSICAL EXAM:   Vitals:   12/11/22 1345  BP: 136/78  Pulse: 72  Weight: 194 lb 8 oz (88.2 kg)  Height: 5\' 8"  (1.727 m)   Body mass index is 29.57 kg/m.  PHYSICAL EXAMNIATION:  Gen: NAD, conversant, well nourised, well groomed                     Cardiovascular:  Regular rate rhythm, no peripheral edema, warm, nontender. Eyes: Conjunctivae clear without exudates or hemorrhage Neck: Supple, no carotid bruits. Pulmonary: Clear to auscultation bilaterally   NEUROLOGICAL EXAM:  MENTAL STATUS: Speech/cognition: Awake, alert, oriented to history taking and casual conversation CRANIAL NERVES: CN II: Visual fields are full to confrontation. Pupils are round equal and briskly reactive to light. CN III, IV, VI: extraocular movement are normal. No ptosis. CN V: Facial sensation is intact to light touch CN VII: Face is symmetric with normal eye closure  CN VIII: Hearing is normal to causal conversation. CN IX, X: Phonation is normal. CN XI: Head turning and shoulder shrug are intact  MOTOR: There is no pronator drift of out-stretched arms. Muscle bulk and tone are normal. Muscle strength is normal.  REFLEXES: Reflexes are 1 and symmetric at the biceps, triceps, knees, and ankles. Plantar responses are flexor.  SENSORY: Intact to light touch, pinprick and vibratory sensation are intact in fingers and toes.  COORDINATION: There is no trunk or limb dysmetria noted.  GAIT/STANCE: Posture is normal. Gait is steady with normal steps, base, arm swing, and turning. Heel and toe walking are normal. Tandem gait is normal.  Romberg is absent.  REVIEW OF SYSTEMS:  Full 14 system review of systems performed and notable only for as above All other review of systems were negative.   ALLERGIES: Allergies  Allergen Reactions   Ibuprofen Other (See Comments)   Lisinopril  Cough     HOME MEDICATIONS: Current Outpatient Medications  Medication Sig Dispense Refill   amLODipine (NORVASC) 10 MG tablet Take 1 tablet (10 mg total) by mouth daily. 30 tablet 1   atorvastatin (LIPITOR) 20 MG tablet TAKE 1 TABLET BY MOUTH EVERY DAY 90 tablet 1   Blood Glucose Monitoring Suppl (ONETOUCH VERIO) w/Device KIT Use to check blood sugar once daily. E11.9 1 kit 0    fluticasone (FLONASE) 50 MCG/ACT nasal spray Place 1 spray into both nostrils daily. 16 g 0   gabapentin (NEURONTIN) 300 MG capsule Take 1 capsule (300 mg total) by mouth at bedtime. 30 capsule 1   glucose blood (ONETOUCH VERIO) test strip Use to check blood sugar once daily. E11.9 100 each 2   ibuprofen (ADVIL) 800 MG tablet TAKE 1 TABLET (800 MG TOTAL) BY MOUTH 2 (TWO) TIMES DAILY AS NEEDED (PAIN). 60 tablet 0   Lancets (ONETOUCH DELICA PLUS LANCET33G) MISC Use to check blood sugar once daily. E11.9 100 each 2   metFORMIN (GLUCOPHAGE) 500 MG tablet Take 1 tablet (500 mg total) by mouth 2 (two) times daily with a meal. 180 tablet 1   methocarbamol (ROBAXIN) 500 MG tablet Take 1 tablet (500 mg total) by mouth 2 (two) times daily as needed for muscle spasms. 60 tablet 0   No current facility-administered medications for this visit.    PAST MEDICAL HISTORY: Past Medical History:  Diagnosis Date   Cervical radiculopathy    Hypertension     PAST SURGICAL HISTORY: Past Surgical History:  Procedure Laterality Date   COLONOSCOPY WITH PROPOFOL N/A 03/01/2014   Procedure: COLONOSCOPY WITH PROPOFOL;  Surgeon: Charolett Bumpers, MD;  Location: [facility];  Service: Endoscopy;  Laterality: N/A;    FAMILY HISTORY: Family History  Problem Relation Age of Onset   Thyroid disease Mother     SOCIAL HISTORY: Social History   Socioeconomic History   Marital status: Married    Spouse name: Not on file   Number of children: Not on file   Years of education: Not on file   Highest education level: Not on file  Occupational History   Not on file  Tobacco Use   Smoking status: Never   Smokeless tobacco: Never  Vaping Use   Vaping Use: Never used  Substance and Sexual Activity   Alcohol use: Not Currently    Comment: rare to occ. monthly   Drug use: No   Sexual activity: Not on file  Other Topics Concern   Not on file  Social History Narrative   He lives with 2 daughter, native of [place],  immigrate to [place] 13 years ago, does not work now.   Social Determinants of Health   Financial Resource Strain: Not on file  Food Insecurity: Not on file  Transportation Needs: Not on file  Physical Activity: Not on file  Stress: Not on file  Social Connections: Not on file  Intimate Partner Violence: Not on file      Levert Feinstein, M.D. Ph.D.  [facility] [address], [address] [place], [place] [address] Ph: [phone] Fax: [phone]  CC:  Ellsworth Lennox, PA-C No address on file  Hoy Register, MD

## 2022-12-13 ENCOUNTER — Telehealth: Payer: Self-pay | Admitting: Neurology

## 2022-12-13 NOTE — Telephone Encounter (Signed)
[employer]BS Berkley Harvey: [id number] exp. 12/13/22-01/11/23, [employer] medicaid Berkley Harvey: [id number] exp. 12/13/22-01/27/23 sent to GI [phone]

## 2022-12-24 ENCOUNTER — Ambulatory Visit: Payer: BC Managed Care – PPO | Admitting: Family Medicine

## 2022-12-24 ENCOUNTER — Other Ambulatory Visit: Payer: Self-pay | Admitting: Family Medicine

## 2022-12-24 DIAGNOSIS — I10 Essential (primary) hypertension: Secondary | ICD-10-CM

## 2022-12-24 NOTE — Telephone Encounter (Signed)
Medication Refill - Medication: amLODipine (NORVASC) 10 MG tablet   Pt stated has been out for two days.   Has the patient contacted their pharmacy? Yes.   No, more refills.   (Agent: If yes, when and what did the pharmacy advise?)  Preferred Pharmacy (with phone number or street name):  Has the patient been seen for an appointment in the last year OR does [id number] - [place] [address], [place] - [address] AT [facility] [address]  [address] [place] [address]  Phone: [phone] Fax: [phone]  Hours: Open 24 hours   the patient have an upcoming appointment? Yes.    Agent: Please be advised that RX refills may take up to 3 business days. We ask that you follow-up with your pharmacy.

## 2022-12-25 MED ORDER — AMLODIPINE BESYLATE 10 MG PO TABS: 10.0000 mg | ORAL_TABLET | Freq: Every day | ORAL | 1 refills | Status: DC

## 2022-12-25 NOTE — Telephone Encounter (Signed)
Requested Prescriptions  Pending Prescriptions Disp Refills   amLODipine (NORVASC) 10 MG tablet 90 tablet 1    Sig: Take 1 tablet (10 mg total) by mouth daily.     Cardiovascular: Calcium Channel Blockers 2 Passed - 12/24/2022  5:46 PM      Passed - Last BP in normal range    BP Readings from Last 1 Encounters:  12/11/22 136/78         Passed - Last Heart Rate in normal range    Pulse Readings from Last 1 Encounters:  12/11/22 72         Passed - Valid encounter within last 6 months    Recent Outpatient Visits           1 month ago Type 2 diabetes mellitus without complication, without long-term current use of insulin [facility])   [place] [facility] [place], Cornelius Moras, RPH-CPP   1 month ago Cervical radiculopathy   [place] [facility] Marcine Matar, MD   1 year ago Benign prostatic hyperplasia with incomplete bladder emptying   [place] [facility] Hoy Register, MD   1 year ago Screening for prostate cancer   [facility] [place], [place], [place]   2 years ago Suspected sleep apnea    [facility] Hoy Register, MD

## 2022-12-30 ENCOUNTER — Ambulatory Visit
Admission: RE | Admit: 2022-12-30 | Discharge: 2022-12-30 | Disposition: A | Discharge status: Home / Self Care | Payer: BC Managed Care – PPO | Source: Ambulatory Visit | Attending: Neurology | Admitting: Neurology

## 2022-12-30 DIAGNOSIS — M5412 Radiculopathy, cervical region: Secondary | ICD-10-CM

## 2023-01-02 ENCOUNTER — Other Ambulatory Visit: Payer: Self-pay | Admitting: Neurology

## 2023-01-05 ENCOUNTER — Other Ambulatory Visit: Payer: Self-pay | Admitting: Family Medicine

## 2023-01-05 DIAGNOSIS — M5412 Radiculopathy, cervical region: Secondary | ICD-10-CM

## 2023-01-07 NOTE — Telephone Encounter (Signed)
Requested Prescriptions  Pending Prescriptions Disp Refills   ibuprofen (ADVIL) 800 MG tablet [Pharmacy Med Name: IBUPROFEN 800 MG TABLET] 60 tablet 0    Sig: TAKE 1 TABLET (800 MG TOTAL) BY MOUTH 2 (TWO) TIMES DAILY AS NEEDED (PAIN).     Analgesics:  NSAIDS Failed - 01/05/2023 12:09 AM      Failed - Manual Review: Labs are only required if the patient has taken medication for more than 8 weeks.      Failed - HGB in normal range and within 360 days    Hemoglobin  Date Value Ref Range Status  11/09/2022 12.6 (L) 13.0 - 17.7 g/dL Final         Passed - Cr in normal range and within 360 days    Creat  Date Value Ref Range Status  06/01/2014 0.95 0.50 - 1.35 mg/dL Final   Creatinine, Ser  Date Value Ref Range Status  11/09/2022 0.85 0.76 - 1.27 mg/dL Final         Passed - PLT in normal range and within 360 days    Platelets  Date Value Ref Range Status  11/09/2022 242 150 - 450 x10E3/uL Final         Passed - HCT in normal range and within 360 days    Hematocrit  Date Value Ref Range Status  11/09/2022 37.9 37.5 - 51.0 % Final         Passed - eGFR is 30 or above and within 360 days    GFR, Est African American  Date Value Ref Range Status  06/01/2014 >89 mL/min Final   GFR calc Af Amer  Date Value Ref Range Status  09/09/2019 103 >59 mL/min/1.73 Final   GFR, Est Non African American  Date Value Ref Range Status  06/01/2014 >89 mL/min Final    Comment:      The estimated GFR is a calculation valid for adults (>=43 years old) that uses the CKD-EPI algorithm to adjust for age and sex. It is   not to be used for children, pregnant women, hospitalized patients,    patients on dialysis, or with rapidly changing kidney function. According to the NKDEP, eGFR >89 is normal, 60-89 shows mild impairment, 30-59 shows moderate impairment, 15-29 shows severe impairment and <15 is ESRD.      GFR calc non Af Amer  Date Value Ref Range Status  09/09/2019 89 >59 mL/min/1.73  Final   eGFR  Date Value Ref Range Status  11/09/2022 99 >59 mL/min/1.73 Final         Passed - Patient is not pregnant      Passed - Valid encounter within last 12 months    Recent Outpatient Visits           1 month ago Type 2 diabetes mellitus without complication, without long-term current use of insulin [facility])   [place] [facility] [place], Cornelius Moras, RPH-CPP   1 month ago Cervical radiculopathy   [place] [facility] Marcine Matar, MD   1 year ago Benign prostatic hyperplasia with incomplete bladder emptying   [place] [facility] Hoy Register, MD   1 year ago Screening for prostate cancer   [facility] [place], [place], [place]   2 years ago Suspected sleep apnea    [facility] Hoy Register, MD

## 2023-01-25 ENCOUNTER — Other Ambulatory Visit: Payer: Self-pay | Admitting: Internal Medicine

## 2023-01-25 DIAGNOSIS — E119 Type 2 diabetes mellitus without complications: Secondary | ICD-10-CM

## 2023-02-20 ENCOUNTER — Telehealth: Payer: Self-pay | Admitting: Neurology

## 2023-02-20 ENCOUNTER — Encounter: Payer: BC Managed Care – PPO | Admitting: Neurology

## 2023-02-20 NOTE — Telephone Encounter (Signed)
Pt called running late, but is on the way. Informed pt your appt was at 12:30pm and would need to reschedule. Pt said, will be there in a minute will go in to see if can be seen.

## 2023-04-01 ENCOUNTER — Encounter (HOSPITAL_COMMUNITY): Payer: Self-pay | Admitting: Emergency Medicine

## 2023-04-01 ENCOUNTER — Ambulatory Visit (INDEPENDENT_AMBULATORY_CARE_PROVIDER_SITE_OTHER): Payer: Medicaid Other

## 2023-04-01 ENCOUNTER — Ambulatory Visit (HOSPITAL_COMMUNITY)
Admission: EM | Admit: 2023-04-01 | Discharge: 2023-04-01 | Disposition: A | Discharge status: Home / Self Care | Payer: Medicaid Other | Attending: Family Medicine | Admitting: Family Medicine

## 2023-04-01 DIAGNOSIS — J069 Acute upper respiratory infection, unspecified: Secondary | ICD-10-CM | POA: Diagnosis not present

## 2023-04-01 DIAGNOSIS — J189 Pneumonia, unspecified organism: Secondary | ICD-10-CM | POA: Diagnosis not present

## 2023-04-01 LAB — POC COVID19/FLU A&B COMBO
Covid Antigen, POC: NEGATIVE
Influenza A Antigen, POC: NEGATIVE
Influenza B Antigen, POC: NEGATIVE

## 2023-04-01 MED ORDER — BENZONATATE 100 MG PO CAPS: 100.0000 mg | ORAL_CAPSULE | Freq: Three times a day (TID) | ORAL | 0 refills | Status: DC | PRN

## 2023-04-01 MED ORDER — LEVOFLOXACIN 500 MG PO TABS: 500.0000 mg | ORAL_TABLET | Freq: Every day | ORAL | 0 refills | Status: AC

## 2023-04-01 NOTE — Discharge Instructions (Addendum)
By my review there is a possible pneumonia in your right middle lung. The radiologist will also read your x-ray, and if their interpretation differs significantly from mine, we will call you.  The flu and COVID tests were negative.  Levaquin 500 mg--take 1 tablet daily for 7 days  Take benzonatate 100 mg, 1 tab every 8 hours as needed for cough.

## 2023-04-01 NOTE — ED Triage Notes (Signed)
Pt  c/o  fatigue, and pain in joints (elbows, knees, and back) on Thursday. States he slept all weekend. Yesterday he began to have a cough, difficulty breathing when walking, and mucus. He took at home COVID test that was negative.

## 2023-04-01 NOTE — ED Provider Notes (Signed)
[facility]    CSN: [id number] Arrival date & time: 04/01/23  1511      History   Chief Complaint Chief Complaint  Patient presents with   Cough    HPI John Collins is a 62 y.o. male.    Cough Here for cough and nasal congestion and fever.  The cough and congestion started yesterday.  He is also felt short of breath.  He does not have a history of asthma  On October 24, he started having some myalgia and malaise.  He did check his temperature but did not document a fever at that time.  On October 26 he did a home COVID swab that was negative.  Of note this was before he started having any cough or nasal congestion  No known exposures  No nausea vomiting or diarrhea.  Past medical history significant for diabetes  Past Medical History:  Diagnosis Date   Cervical radiculopathy    Hypertension     Patient Active Problem List   Diagnosis Date Noted   Type 2 diabetes mellitus without complication, without long-term current use of insulin (HCC) 11/23/2022   Lateral epicondylitis of right elbow 07/31/2018   Mixed hyperlipidemia 03/01/2014   Essential hypertension, benign 11/11/2013   Right cervical radiculopathy     Past Surgical History:  Procedure Laterality Date   COLONOSCOPY WITH PROPOFOL N/A 03/01/2014   Procedure: COLONOSCOPY WITH PROPOFOL;  Surgeon: Charolett Bumpers, MD;  Location: [facility];  Service: Endoscopy;  Laterality: N/A;       Home Medications    Prior to Admission medications   Medication Sig Start Date End Date Taking? Authorizing Provider  benzonatate (TESSALON) 100 MG capsule Take 1 capsule (100 mg total) by mouth 3 (three) times daily as needed for cough. 04/01/23  Yes Ailany Koren, Janace Aris, MD  levofloxacin (LEVAQUIN) 500 MG tablet Take 1 tablet (500 mg total) by mouth daily for 7 days. 04/01/23 04/08/23 Yes Zenia Resides, MD  amLODipine (NORVASC) 10 MG tablet Take 1 tablet (10 mg total) by mouth daily. 12/25/22   Hoy Register, MD  atorvastatin (LIPITOR) 20 MG tablet TAKE 1 TABLET BY MOUTH EVERY DAY 12/04/22   Hoy Register, MD  Blood Glucose Monitoring Suppl (ACCU-CHEK GUIDE ME) w/Device KIT USE TO CHECK BLOOD SUGAR ONCE DAILY. E11.9 01/28/23   Hoy Register, MD  DULoxetine (CYMBALTA) 60 MG capsule TAKE 1 CAPSULE BY MOUTH EVERY DAY 01/02/23   Levert Feinstein, MD  fluticasone [facility]) 50 MCG/ACT nasal spray Place 1 spray into both nostrils daily. 03/25/21   Raspet, Noberto Retort, PA-C  gabapentin (NEURONTIN) 300 MG capsule Take 3 capsules (900 mg total) by mouth at bedtime. Patient not taking: Reported on 04/01/2023 12/11/22   Levert Feinstein, MD  glucose blood [facility] VERIO) test strip Use to check blood sugar once daily. E11.9 11/23/22   Marcine Matar, MD  Lancets [facility] DELICA PLUS [place]) MISC Use to check blood sugar once daily. E11.9 11/23/22   Marcine Matar, MD  metFORMIN (GLUCOPHAGE) 500 MG tablet Take 1 tablet (500 mg total) by mouth 2 (two) times daily with a meal. 11/23/22   Marcine Matar, MD  methocarbamol (ROBAXIN) 500 MG tablet Take 1 tablet (500 mg total) by mouth 2 (two) times daily as needed for muscle spasms. 11/09/22   Marcine Matar, MD    Family History Family History  Problem Relation Age of Onset   Thyroid disease Mother     Social History Social History  Tobacco Use   Smoking status: Never   Smokeless tobacco: Never  Vaping Use   Vaping status: Never Used  Substance Use Topics   Alcohol use: Not Currently    Comment: rare to occ. monthly   Drug use: No     Allergies   Ibuprofen and Lisinopril   Review of Systems Review of Systems  Respiratory:  Positive for cough.      Physical Exam Triage Vital Signs ED Triage Vitals  Encounter Vitals Group     BP 04/01/23 1621 139/85     Systolic BP Percentile --      Diastolic BP Percentile --      Pulse Rate 04/01/23 1621 98     Resp 04/01/23 1621 17     Temp 04/01/23 1621 (!) 100.4 F (38 C)     Temp Source  04/01/23 1621 Oral     SpO2 04/01/23 1621 95 %     Weight --      Height --      Head Circumference --      Peak Flow --      Pain Score 04/01/23 1619 3     Pain Loc --      Pain Education --      Exclude from Growth Chart --    No data found.  Updated Vital Signs BP 139/85 (BP Location: Right Arm)   Pulse 98   Temp (!) 100.4 F (38 C) (Oral)   Resp 17   SpO2 95%   Visual Acuity Right Eye Distance:   Left Eye Distance:   Bilateral Distance:    Right Eye Near:   Left Eye Near:    Bilateral Near:     Physical Exam Vitals reviewed.  Constitutional:      General: He is not in acute distress.    Appearance: He is not ill-appearing, toxic-appearing or diaphoretic.  HENT:     Nose: Congestion present.     Mouth/Throat:     Mouth: Mucous membranes are moist.     Pharynx: No oropharyngeal exudate or posterior oropharyngeal erythema.  Eyes:     Extraocular Movements: Extraocular movements intact.     Conjunctiva/sclera: Conjunctivae normal.     Pupils: Pupils are equal, round, and reactive to light.  Cardiovascular:     Rate and Rhythm: Normal rate and regular rhythm.     Heart sounds: No murmur heard. Pulmonary:     Effort: No respiratory distress.     Breath sounds: No stridor. No wheezing, rhonchi or rales.     Comments: Breath sounds are distant. Musculoskeletal:     Cervical back: Neck supple.  Lymphadenopathy:     Cervical: No cervical adenopathy.  Skin:    Capillary Refill: Capillary refill takes less than 2 seconds.     Coloration: Skin is not jaundiced or pale.  Neurological:     General: No focal deficit present.     Mental Status: He is alert and oriented to person, place, and time.  Psychiatric:        Behavior: Behavior normal.      UC Treatments / Results  Labs (all labs ordered are listed, but only abnormal results are displayed) Labs Reviewed  POC COVID19/FLU A&B COMBO    EKG   Radiology No results found.  Procedures Procedures  (including critical care time)  Medications Ordered in UC Medications - No data to display  Initial Impression / Assessment and Plan / UC Course  I have reviewed  the triage vital signs and the nursing notes.  Pertinent labs & imaging results that were available during my care of the patient were reviewed by me and considered in my medical decision making (see chart for details).     By my review there is consolidation in the right middle lobe. Flu and COVID swabs are negative again here.  Levaquin is sent in for pneumonia and Tessalon Perles are sent in for the cough.  Final Clinical Impressions(s) / UC Diagnoses   Final diagnoses:  Community acquired pneumonia of right middle lobe of lung  Viral URI with cough     Discharge Instructions      By my review there is a possible pneumonia in your right middle lung. The radiologist will also read your x-ray, and if their interpretation differs significantly from mine, we will call you.  The flu and COVID tests were negative.  Levaquin 500 mg--take 1 tablet daily for 7 days  Take benzonatate 100 mg, 1 tab every 8 hours as needed for cough.       ED Prescriptions     Medication Sig Dispense Auth. Provider   levofloxacin (LEVAQUIN) 500 MG tablet Take 1 tablet (500 mg total) by mouth daily for 7 days. 7 tablet Laurana Magistro, Janace Aris, MD   benzonatate (TESSALON) 100 MG capsule Take 1 capsule (100 mg total) by mouth 3 (three) times daily as needed for cough. 21 capsule Zenia Resides, MD      PDMP not reviewed this encounter.   Zenia Resides, MD 04/01/23 [id number]

## 2023-04-02 ENCOUNTER — Telehealth (HOSPITAL_COMMUNITY): Payer: Self-pay | Admitting: Family Medicine

## 2023-04-02 NOTE — Telephone Encounter (Signed)
Radiology report states the area I thougth was infiltrate could be pneumonia, or it could be a mass.  I have called the patient on his phone at [phone], and no answer. I left a message. I will try again later.

## 2023-04-04 NOTE — Telephone Encounter (Signed)
Spoke with pt at 0840 on 10/31. Relayed results of the CXR report. He is feeling some better.   He will make a f/u appt with his PCP for 1-2 weeks from now, so that they can evaluate him and redo his CXR at that time.  He is aware that if the abnormality is still present on the repeat CXR, he will most likely need other imaging.   FYI, Dr. Alvis Lemmings

## 2023-04-05 ENCOUNTER — Telehealth (INDEPENDENT_AMBULATORY_CARE_PROVIDER_SITE_OTHER): Payer: Self-pay | Admitting: Family Medicine

## 2023-04-05 ENCOUNTER — Telehealth (INDEPENDENT_AMBULATORY_CARE_PROVIDER_SITE_OTHER): Payer: Self-pay | Admitting: Primary Care

## 2023-04-05 NOTE — Telephone Encounter (Signed)
Thanks for the heads up.

## 2023-04-05 NOTE — Telephone Encounter (Signed)
Called to schedule apt with dr Sherian Rein. No answer and VM left.

## 2023-04-05 NOTE — Telephone Encounter (Signed)
Called pt about apt.

## 2023-04-05 NOTE — Telephone Encounter (Signed)
Can you please have him schedule an office visit with me for follow-up of his medical conditions and chest x-ray?  Thank you.

## 2023-04-05 NOTE — Telephone Encounter (Signed)
John Collins could you contact pt and schedule an appt per Dr. Alvis Lemmings

## 2023-04-14 ENCOUNTER — Ambulatory Visit (HOSPITAL_COMMUNITY)
Admission: EM | Admit: 2023-04-14 | Discharge: 2023-04-14 | Disposition: A | Discharge status: Home / Self Care | Payer: Medicaid Other | Attending: Emergency Medicine | Admitting: Emergency Medicine

## 2023-04-14 ENCOUNTER — Ambulatory Visit (INDEPENDENT_AMBULATORY_CARE_PROVIDER_SITE_OTHER): Payer: Medicaid Other

## 2023-04-14 ENCOUNTER — Encounter (HOSPITAL_COMMUNITY): Payer: Self-pay

## 2023-04-14 DIAGNOSIS — J189 Pneumonia, unspecified organism: Secondary | ICD-10-CM | POA: Diagnosis not present

## 2023-04-14 DIAGNOSIS — R059 Cough, unspecified: Secondary | ICD-10-CM

## 2023-04-14 DIAGNOSIS — M546 Pain in thoracic spine: Secondary | ICD-10-CM | POA: Diagnosis not present

## 2023-04-14 DIAGNOSIS — J029 Acute pharyngitis, unspecified: Secondary | ICD-10-CM

## 2023-04-14 LAB — POC COVID19/FLU A&B COMBO

## 2023-04-14 MED ORDER — AZITHROMYCIN 250 MG PO TABS: 250.0000 mg | ORAL_TABLET | Freq: Every day | ORAL | 0 refills | Status: DC

## 2023-04-14 MED ORDER — AMOXICILLIN-POT CLAVULANATE 875-125 MG PO TABS: 1.0000 | ORAL_TABLET | Freq: Two times a day (BID) | ORAL | 0 refills | Status: DC

## 2023-04-14 MED ORDER — KETOROLAC TROMETHAMINE 30 MG/ML IJ SOLN
30.0000 mg | Freq: Once | INTRAMUSCULAR | Status: AC
  Administered 2023-04-14: 30 mg via INTRAMUSCULAR

## 2023-04-14 MED ORDER — KETOROLAC TROMETHAMINE 30 MG/ML IJ SOLN
INTRAMUSCULAR | Status: AC
  Filled 2023-04-14: qty 1

## 2023-04-14 NOTE — ED Triage Notes (Signed)
Back pain onset last night. No falls or known injuries. States felt feverish. Scratchy throat and cough as well. No known sick exposure.  Patient has not taken any meds for his symptoms.

## 2023-04-14 NOTE — Discharge Instructions (Addendum)
We have given you a Toradol injection in clinic to help with your sore throat and back pain.  The official radiology interpretation of your x-ray is pending, I will contact you if they still see the mass or if the pneumonia remains.  In the meantime you can do warm saline gargles and over-the-counter cough drops for your sore throat.  Sometimes sleeping with a humidifier can help as well.  You can do heat or ice and gentle stretching to the back.  Please follow-up with your primary care provider if your symptoms persist.

## 2023-04-14 NOTE — ED Provider Notes (Signed)
[facility]    CSN: [id number] Arrival date & time: 04/14/23  1405      History   Chief Complaint Chief Complaint  Patient presents with   Back Pain   Sore Throat    HPI John Collins is a 62 y.o. male.   Patient presents to clinic with thoracic back pain that started last night.  He did feel feverish today, did not check his temperature.  He has a scratchy throat and a dry cough.  No recent sick exposures.  He has not tried any medications for his symptoms.  Was seen on 10/28 and treated for PNA with Levaquin.  The radiology interpretation of his x-ray at this time suspected pneumonia, but advised follow-up for a right lobe mass.  His primary care provider has tried to contact him to schedule this, but he has not returned their calls.  He has finished the Levaquin.  Reports feeling the same symptoms that he felt previously when he was diagnosed with pneumonia.  Has been having joint pain and feeling like he got beat up.     The history is provided by the patient and medical records.  Back Pain Sore Throat    Past Medical History:  Diagnosis Date   Cervical radiculopathy    Hypertension     Patient Active Problem List   Diagnosis Date Noted   Type 2 diabetes mellitus without complication, without long-term current use of insulin (HCC) 11/23/2022   Lateral epicondylitis of right elbow 07/31/2018   Mixed hyperlipidemia 03/01/2014   Essential hypertension, benign 11/11/2013   Right cervical radiculopathy     Past Surgical History:  Procedure Laterality Date   COLONOSCOPY WITH PROPOFOL N/A 03/01/2014   Procedure: COLONOSCOPY WITH PROPOFOL;  Surgeon: Charolett Bumpers, MD;  Location: [facility];  Service: Endoscopy;  Laterality: N/A;       Home Medications    Prior to Admission medications   Medication Sig Start Date End Date Taking? Authorizing Provider  amLODipine (NORVASC) 10 MG tablet Take 1 tablet (10 mg total) by mouth daily. 12/25/22  Yes  Hoy Register, MD  amoxicillin-clavulanate (AUGMENTIN) 875-125 MG tablet Take 1 tablet by mouth every 12 (twelve) hours. 04/14/23  Yes Rinaldo Ratel, [place] N, FNP  atorvastatin (LIPITOR) 20 MG tablet TAKE 1 TABLET BY MOUTH EVERY DAY 12/04/22  Yes Newlin, Odette Horns, MD  azithromycin (ZITHROMAX) 250 MG tablet Take 1 tablet (250 mg total) by mouth daily. Take first 2 tablets together, then 1 every day until finished. 04/14/23  Yes Rinaldo Ratel, [place] N, FNP  benzonatate (TESSALON) 100 MG capsule Take 1 capsule (100 mg total) by mouth 3 (three) times daily as needed for cough. 04/01/23  Yes Zenia Resides, MD  Blood Glucose Monitoring Suppl (ACCU-CHEK GUIDE ME) w/Device KIT USE TO CHECK BLOOD SUGAR ONCE DAILY. E11.9 01/28/23  Yes Newlin, Odette Horns, MD  DULoxetine (CYMBALTA) 60 MG capsule TAKE 1 CAPSULE BY MOUTH EVERY DAY 01/02/23  Yes Levert Feinstein, MD  fluticasone (FLONASE) 50 MCG/ACT nasal spray Place 1 spray into both nostrils daily. 03/25/21  Yes Raspet, Erin K, PA-C  glucose blood (ONETOUCH VERIO) test strip Use to check blood sugar once daily. E11.9 11/23/22  Yes Marcine Matar, MD  Lancets [facility] DELICA PLUS [place]) MISC Use to check blood sugar once daily. E11.9 11/23/22  Yes Marcine Matar, MD  metFORMIN (GLUCOPHAGE) 500 MG tablet Take 1 tablet (500 mg total) by mouth 2 (two) times daily with a meal. 11/23/22  Yes Jonah Blue  B, MD  methocarbamol (ROBAXIN) 500 MG tablet Take 1 tablet (500 mg total) by mouth 2 (two) times daily as needed for muscle spasms. 11/09/22  Yes Marcine Matar, MD  gabapentin (NEURONTIN) 300 MG capsule Take 3 capsules (900 mg total) by mouth at bedtime. Patient not taking: Reported on 04/01/2023 12/11/22   Levert Feinstein, MD    Family History Family History  Problem Relation Age of Onset   Thyroid disease Mother     Social History Social History   Tobacco Use   Smoking status: Never   Smokeless tobacco: Never  Vaping Use   Vaping status: Never Used   Substance Use Topics   Alcohol use: Not Currently    Comment: rare to occ. monthly   Drug use: No     Allergies   Ibuprofen and Lisinopril   Review of Systems Review of Systems  Per HPI   Physical Exam Triage Vital Signs ED Triage Vitals  Encounter Vitals Group     BP 04/14/23 1445 (!) 148/82     Systolic BP Percentile --      Diastolic BP Percentile --      Pulse Rate 04/14/23 1445 79     Resp 04/14/23 1445 18     Temp 04/14/23 1445 98.1 F (36.7 C)     Temp Source 04/14/23 1445 Oral     SpO2 04/14/23 1445 97 %     Weight 04/14/23 1444 194 lb 7.1 oz (88.2 kg)     Height 04/14/23 1444 5\' 8"  (1.727 m)     Head Circumference --      Peak Flow --      Pain Score 04/14/23 1443 7     Pain Loc --      Pain Education --      Exclude from Growth Chart --    No data found.  Updated Vital Signs BP (!) 148/82 (BP Location: Right Arm)   Pulse 79   Temp 98.1 F (36.7 C) (Oral)   Resp 18   Ht 5\' 8"  (1.727 m)   Wt 194 lb 7.1 oz (88.2 kg)   SpO2 97%   BMI 29.57 kg/m   Visual Acuity Right Eye Distance:   Left Eye Distance:   Bilateral Distance:    Right Eye Near:   Left Eye Near:    Bilateral Near:     Physical Exam Vitals and nursing note reviewed.  Constitutional:      Appearance: Normal appearance. He is well-developed.  HENT:     Head: Normocephalic and atraumatic.     Right Ear: External ear normal.     Left Ear: External ear normal.     Nose: Nose normal.     Mouth/Throat:     Mouth: Mucous membranes are moist.     Pharynx: Posterior oropharyngeal erythema present.     Tonsils: No tonsillar exudate or tonsillar abscesses. 0 on the right. 0 on the left.  Eyes:     Conjunctiva/sclera: Conjunctivae normal.  Cardiovascular:     Rate and Rhythm: Normal rate and regular rhythm.     Heart sounds: Normal heart sounds. No murmur heard. Pulmonary:     Effort: Pulmonary effort is normal. No respiratory distress.     Breath sounds: Normal breath sounds.   Musculoskeletal:        General: Normal range of motion.     Cervical back: Normal range of motion.  Skin:    General: Skin is warm and dry.  Neurological:     General: No focal deficit present.     Mental Status: He is alert and oriented to person, place, and time.  Psychiatric:        Mood and Affect: Mood normal.        Behavior: Behavior normal. Behavior is cooperative.      UC Treatments / Results  Labs (all labs ordered are listed, but only abnormal results are displayed) Labs Reviewed  POC COVID19/FLU A&B COMBO    EKG   Radiology DG Chest 2 View  Result Date: 04/14/2023 CLINICAL DATA:  Cough and back pain.  History of pneumonia EXAM: CHEST - 2 VIEW COMPARISON:  04/01/2023 FINDINGS: Midline trachea. Normal heart size and mediastinal contours. Diffuse interstitial thickening is mild. Somewhat more confluent opacity in the right upper lobe. The nodular component on the prior exam is no longer identified. There may also be left base airspace disease. IMPRESSION: Suspicion of right upper and left lower lobe airspace disease, favoring pneumonia. Followup PA and lateral chest X-ray is recommended in 3-4 weeks following trial of antibiotic therapy to ensure resolution and exclude underlying malignancy. The right upper lobe nodular density on the 04/01/2023 chest radiograph no longer identified. Electronically Signed   By: Jeronimo Greaves M.D.   On: 04/14/2023 16:31    Procedures Procedures (including critical care time)  Medications Ordered in UC Medications  ketorolac (TORADOL) 30 MG/ML injection 30 mg (30 mg Intramuscular Given 04/14/23 1608)    Initial Impression / Assessment and Plan / UC Course  I have reviewed the triage vital signs and the nursing notes.  Pertinent labs & imaging results that were available during my care of the patient were reviewed by me and considered in my medical decision making (see chart for details).  Vitals and triage reviewed, patient is  hemodynamically stable.  Lungs are vesicular, heart with regular rate and rhythm.  He has finished the Levaquin. Symptoms returned last night with back pain, sore throat and cough.  POC COVID and flu testing negative.    Has not completed the chest CT as recommended for potential lung mass, but it is no longer visualized on CXR completed today.  Repeat imaging shows suspicion of right upper and left lower lobe airspace disease, favoring pneumonia. Followup PA and lateral chest X-ray is recommended in 3-4 weeks following trial of antibiotic therapy to ensure resolution and exclude underlying malignancy.  Patient contacted via phone and notified of results, started on Augmentin and a macrolide for CAP.   The right upper lobe nodular density on the 04/01/2023 chest radiograph no longer identified.  IM Toradol given in clinic for back pain.  Plan of care, follow-up care return precautions given, no questions at this time.    Final Clinical Impressions(s) / UC Diagnoses   Final diagnoses:  Acute bilateral thoracic back pain  Acute pharyngitis, unspecified etiology  Community acquired pneumonia, unspecified laterality     Discharge Instructions      We have given you a Toradol injection in clinic to help with your sore throat and back pain.  The official radiology interpretation of your x-ray is pending, I will contact you if they still see the mass or if the pneumonia remains.  In the meantime you can do warm saline gargles and over-the-counter cough drops for your sore throat.  Sometimes sleeping with a humidifier can help as well.  You can do heat or ice and gentle stretching to the back.  Please follow-up with your primary  care provider if your symptoms persist.      ED Prescriptions     Medication Sig Dispense Auth. Provider   amoxicillin-clavulanate (AUGMENTIN) 875-125 MG tablet Take 1 tablet by mouth every 12 (twelve) hours. 14 tablet Rinaldo Ratel, [place] N, [place]   azithromycin  (ZITHROMAX) 250 MG tablet Take 1 tablet (250 mg total) by mouth daily. Take first 2 tablets together, then 1 every day until finished. 6 tablet Zharia Conrow, [place] N, [place]      PDMP not reviewed this encounter.   Deysi Soldo, [place] N, [place] 04/14/23 1640

## 2023-05-08 ENCOUNTER — Ambulatory Visit (INDEPENDENT_AMBULATORY_CARE_PROVIDER_SITE_OTHER): Payer: Self-pay | Admitting: Neurology

## 2023-05-08 VITALS — BP 167/95 | HR 64 | Ht 68.0 in

## 2023-05-08 DIAGNOSIS — Z0289 Encounter for other administrative examinations: Secondary | ICD-10-CM

## 2023-05-08 DIAGNOSIS — M5412 Radiculopathy, cervical region: Secondary | ICD-10-CM

## 2023-05-08 DIAGNOSIS — G5601 Carpal tunnel syndrome, right upper limb: Secondary | ICD-10-CM | POA: Insufficient documentation

## 2023-05-08 NOTE — Procedures (Signed)
Full Name: John Collins Gender: Male MRN #: [id number] Date of Birth: 08/20/1960    Visit Date: 05/08/2023 14:33 Age: 62 Years Examining Physician: Dr. Levert Feinstein Referring Physician: Dr. Levert Feinstein Height: 5 feet 8 inch History: 61 year old right-handed male, known history of cervical degenerative disease, with recurrent neck pain radiating pain to right  shoulder  Summary of the test: Nerve conduction study: Left median, ulnar sensory and motor responses showed no significant abnormality, the mildly prolonged peak latency of bilateral ulnar sensory response could due to mildly cold limb temperature  Right median sensory showed mildly prolonged peak latency, right median mixed response with 0.5 ms prolonged compared to ipsilateral ulnar mixed response.  Right radial sensory response was normal.  Electromyography: Selected needle examinations of bilateral upper extremity and cervical paraspinal muscles were normal.   Conclusion: This is a slight abnormal study.  There is electrodiagnostic evidence of slight right median neuropathy across the wrist consistent with mild right carpal tunnel syndromes, there is no evidence of active bilateral cervical radiculopathy.    Levert Feinstein. M.D. Ph.D.   [facility] [address] [place], [place] [address] Tel: [phone] Fax: [phone]  Verbal informed consent was obtained from the patient, patient was informed of potential risk of procedure, including bruising, bleeding, hematoma formation, infection, muscle weakness, muscle pain, numbness, among others.        MNC    Nerve / Sites Muscle Latency Ref. Amplitude Ref. Rel Amp Segments Distance Velocity Ref. Area    ms ms mV mV %  cm m/s m/s mVms  R Median - APB     Wrist APB 4.0 <=4.4 7.5 >=4.0 100 Wrist - APB 7   31.5     Upper arm APB 8.8  7.3  96.1 Upper arm - Wrist 24 50 >=49 31.5  L Median - APB     Wrist APB 3.7 <=4.4 10.4 >=4.0 100 Wrist -  APB 7   35.7     Upper arm APB 8.4  9.7  93.8 Upper arm - Wrist 25 54 >=49 33.9  R Ulnar - ADM     Wrist ADM 2.8 <=3.3 13.3 >=6.0 100 Wrist - ADM 7   41.0     B.Elbow ADM 6.1  11.6  87.3 B.Elbow - Wrist 18 54 >=49 41.1     A.Elbow ADM 8.6  11.1  95.9 A.Elbow - B.Elbow 13 52 >=49 40.2  L Ulnar - ADM     Wrist ADM 2.6 <=3.3 8.9 >=6.0 100 Wrist - ADM 7   33.7     B.Elbow ADM 4.9  8.3  93.2 B.Elbow - Wrist 12 52 >=49 31.8     A.Elbow ADM 8.1  7.3  88 A.Elbow - B.Elbow 17 52 >=49 30.3             SNC    Nerve / Sites Rec. Site Peak Lat Ref.  Amp Ref. Segments Distance Peak Diff Ref.    ms ms V V  cm ms ms  R Radial - Anatomical snuff box (Forearm)     Forearm Wrist 2.4 <=2.9 24 >=15 Forearm - Wrist 10    R Median, Ulnar - Transcarpal comparison     Median Palm Wrist 2.9 <=2.2 25 >=35 Median Palm - Wrist 8       Ulnar Palm Wrist 2.4 <=2.2 8 >=12 Ulnar Palm - Wrist 8          Median Palm -  Ulnar Palm  0.5 <=0.4  L Median, Ulnar - Transcarpal comparison     Median Palm Wrist 2.6 <=2.2 37 >=35 Median Palm - Wrist 8       Ulnar Palm Wrist 2.4 <=2.2 8 >=12 Ulnar Palm - Wrist 8          Median Palm - Ulnar Palm  0.2 <=0.4  R Median - Orthodromic (Dig II, Mid palm)     Dig II Wrist 3.9 <=3.4 10 >=10 Dig II - Wrist 13    L Median - Orthodromic (Dig II, Mid palm)     Dig II Wrist 3.4 <=3.4 11 >=10 Dig II - Wrist 13    R Ulnar - Orthodromic, (Dig V, Mid palm)     Dig V Wrist 3.5 <=3.1 6 >=5 Dig V - Wrist 11    L Ulnar - Orthodromic, (Dig V, Mid palm)     Dig V Wrist 3.2 <=3.1 5 >=5 Dig V - Wrist 4                     F  Wave    Nerve F Lat Ref.   ms ms  R Ulnar - ADM 29.3 <=32.0  L Ulnar - ADM 29.7 <=32.0         EMG Summary Table    Spontaneous MUAP Recruitment  Muscle IA Fib PSW Fasc Other Amp Dur. Poly Pattern  R. First dorsal interosseous Normal None None None _______ Normal Normal Normal Normal  R. Pronator teres Normal None None None _______ Normal Normal Normal Normal  R.  Biceps brachii Normal None None None _______ Normal Normal Normal Normal  R. Deltoid Normal None None None _______ Normal Normal Normal Normal  R. Triceps brachii Normal None None None _______ Normal Normal Normal Normal  R. Brachioradialis Normal None None None _______ Normal Normal Normal Normal  L. First dorsal interosseous Normal None None None _______ Normal Normal Normal Normal  L. Pronator teres Normal None None None _______ Normal Normal Normal Normal  L. Triceps brachii Normal None None None _______ Normal Normal Normal Normal  L. Thoracic paraspinals Normal None None None _______ Normal Normal Normal Normal  L. Deltoid Normal None None None _______ Normal Normal Normal Normal  L. Biceps brachii Normal None None None _______ Normal Normal Normal Normal  R. Cervical paraspinals Normal None None None _______ Normal Normal Normal Normal

## 2023-05-08 NOTE — Progress Notes (Signed)
No chief complaint on file.     ASSESSMENT AND PLAN  John Collins is a 62 y.o. male   Transient worsening right neck pain, radiating pain to right shoulder,  Symptoms was suggestive of right cervical radiculopathy, which has much improved with Cymbalta, neck stretching exercise,  MRI of cervical spine showed Mild spinal stenosis and moderate right foraminal narrowing at both C5-C6 and C6-C7. There is no definite nerve root compression though the degenerative changes encroach upon the right C6 and C7 nerve roots.      EMG nerve conduction study today with no evidence of active cervical radiculopathy only mild right carpal tunnel syndromes.  His symptoms has much improved taking Cymbalta 60 mg daily, advised him to taper off medications if he remains symptom-free,  Only return to clinic for new issues DIAGNOSTIC DATA (LABS, IMAGING, TESTING) - I reviewed patient records, labs, notes, testing and imaging myself where available.   MEDICAL HISTORY:  John Collins, is a 62 year old male seen in request by Dr. Hoy Register for evaluation of worsening neck pain, radiating pain to right shoulder,  I reviewed and summarized the referring note. PMHX DM HLD HTN  He now works a Health and safety inspector job, complains of worsening neck pain, radiating pain to right shoulder, arm, right hand, muscle spasm, getting worse since beginning of 2024 to the point of difficulty for him to continue his job, difficulty sleeping, because of the constant pain,   He also complains of muscle spasm seems to move the left shoulder some, denies gait abnormality, denies bowel or bladder incontinence  I saw him in 2014 for similar complaints, MRI in April 2014, describes C5-6, right uncovertebral bone spur disc projection with severe right foraminal stenosis, potential impingement upon right C6 nerve roots, also mild left foraminal stenosis, no evidence of canal stenosis  UPDATE Dec 4th 2024: Cymbalta has helped his neck  pain, he has much better range of motion, tolerating it well,  Personally reviewed MRI cervical spine from July 2024, degenerative changes at C5-6, C6-7, no significant canal stenosis, moderate right foraminal narrowing at C6-7,  EMG nerve conduction study today showed no evidence of active cervical radiculopathy, only mild right carpal tunnel syndromes  PHYSICAL EXAM:      05/08/2023    2:25 PM 04/14/2023    2:45 PM 04/14/2023    2:44 PM  Vitals with BMI  Height 5\' 8"   5\' 8"   Weight   194 lbs 7 oz  BMI   29.57  Systolic 167 148   Diastolic 95 82   Pulse 64 79      PHYSICAL EXAMNIATION:  Gen: NAD, conversant, well nourised, well groomed                     Cardiovascular: Regular rate rhythm, no peripheral edema, warm, nontender. Eyes: Conjunctivae clear without exudates or hemorrhage Neck: Supple, no carotid bruits. Pulmonary: Clear to auscultation bilaterally   NEUROLOGICAL EXAM:  MENTAL STATUS: Speech/cognition: Awake, alert, oriented to history taking and casual conversation CRANIAL NERVES: CN II: Visual fields are full to confrontation. Pupils are round equal and briskly reactive to light. CN III, IV, VI: extraocular movement are normal. No ptosis. CN V: Facial sensation is intact to light touch CN VII: Face is symmetric with normal eye closure  CN VIII: Hearing is normal to causal conversation. CN IX, X: Phonation is normal. CN XI: Head turning and shoulder shrug are intact  MOTOR: There is no pronator drift of out-stretched arms.  Muscle bulk and tone are normal. Muscle strength is normal.  REFLEXES: Reflexes are 1 and symmetric at the biceps, triceps, knees, and ankles. Plantar responses are flexor.  SENSORY: Intact to light touch, pinprick and vibratory sensation are intact in fingers and toes.  COORDINATION: There is no trunk or limb dysmetria noted.  GAIT/STANCE: Posture is normal. Gait is steady   REVIEW OF SYSTEMS:  Full 14 system review of  systems performed and notable only for as above All other review of systems were negative.   ALLERGIES: Allergies  Allergen Reactions   Ibuprofen Other (See Comments)   Lisinopril     Cough     HOME MEDICATIONS: Current Outpatient Medications  Medication Sig Dispense Refill   amLODipine (NORVASC) 10 MG tablet Take 1 tablet (10 mg total) by mouth daily. 90 tablet 1   amoxicillin-clavulanate (AUGMENTIN) 875-125 MG tablet Take 1 tablet by mouth every 12 (twelve) hours. 14 tablet 0   atorvastatin (LIPITOR) 20 MG tablet TAKE 1 TABLET BY MOUTH EVERY DAY 90 tablet 1   azithromycin (ZITHROMAX) 250 MG tablet Take 1 tablet (250 mg total) by mouth daily. Take first 2 tablets together, then 1 every day until finished. 6 tablet 0   benzonatate (TESSALON) 100 MG capsule Take 1 capsule (100 mg total) by mouth 3 (three) times daily as needed for cough. 21 capsule 0   Blood Glucose Monitoring Suppl (ACCU-CHEK GUIDE ME) w/Device KIT USE TO CHECK BLOOD SUGAR ONCE DAILY. E11.9 1 kit 0   DULoxetine (CYMBALTA) 60 MG capsule TAKE 1 CAPSULE BY MOUTH EVERY DAY 90 capsule 3   fluticasone (FLONASE) 50 MCG/ACT nasal spray Place 1 spray into both nostrils daily. 16 g 0   gabapentin (NEURONTIN) 300 MG capsule Take 3 capsules (900 mg total) by mouth at bedtime. (Patient not taking: Reported on 04/01/2023) 90 capsule 11   glucose blood (ONETOUCH VERIO) test strip Use to check blood sugar once daily. E11.9 100 each 2   Lancets (ONETOUCH DELICA PLUS LANCET33G) MISC Use to check blood sugar once daily. E11.9 100 each 2   metFORMIN (GLUCOPHAGE) 500 MG tablet Take 1 tablet (500 mg total) by mouth 2 (two) times daily with a meal. 180 tablet 1   methocarbamol (ROBAXIN) 500 MG tablet Take 1 tablet (500 mg total) by mouth 2 (two) times daily as needed for muscle spasms. 60 tablet 0   No current facility-administered medications for this visit.    PAST MEDICAL HISTORY: Past Medical History:  Diagnosis Date   Cervical  radiculopathy    Hypertension     PAST SURGICAL HISTORY: Past Surgical History:  Procedure Laterality Date   COLONOSCOPY WITH PROPOFOL N/A 03/01/2014   Procedure: COLONOSCOPY WITH PROPOFOL;  Surgeon: Charolett Bumpers, MD;  Location: [facility];  Service: Endoscopy;  Laterality: N/A;    FAMILY HISTORY: Family History  Problem Relation Age of Onset   Thyroid disease Mother     SOCIAL HISTORY: Social History   Socioeconomic History   Marital status: Married    Spouse name: Not on file   Number of children: Not on file   Years of education: Not on file   Highest education level: Not on file  Occupational History   Not on file  Tobacco Use   Smoking status: Never   Smokeless tobacco: Never  Vaping Use   Vaping status: Never Used  Substance and Sexual Activity   Alcohol use: Not Currently    Comment: rare to occ. monthly   Drug  use: No   Sexual activity: Not on file  Other Topics Concern   Not on file  Social History Narrative   He lives with 2 daughter, native of [place], immigrate to [place] 13 years ago, does not work now.   Social Determinants of Health   Financial Resource Strain: Not on file  Food Insecurity: Not on file  Transportation Needs: Not on file  Physical Activity: Not on file  Stress: Not on file  Social Connections: Not on file  Intimate Partner Violence: Not on file      Levert Feinstein, M.D. Ph.D.  [facility] [address] [place], [place] [address] Ph: [phone] Fax: [phone]  CC:  Hoy Register, MD [address] [place],  [place] [address]  Hoy Register, MD

## 2023-05-17 NOTE — Progress Notes (Signed)
EMG is under procedure tab 

## 2023-06-27 ENCOUNTER — Other Ambulatory Visit: Payer: Self-pay | Admitting: Family Medicine

## 2023-06-27 ENCOUNTER — Other Ambulatory Visit: Payer: Self-pay | Admitting: Internal Medicine

## 2023-06-27 DIAGNOSIS — I10 Essential (primary) hypertension: Secondary | ICD-10-CM

## 2023-06-27 DIAGNOSIS — E119 Type 2 diabetes mellitus without complications: Secondary | ICD-10-CM

## 2023-06-27 NOTE — Telephone Encounter (Signed)
Requested medication (s) are due for refill today: yes  Requested medication (s) are on the active medication list: yes  Last refill:  12/25/22 #90 1 RF  Future visit scheduled: no  Notes to clinic:  needs appt   Requested Prescriptions  Pending Prescriptions Disp Refills   amLODipine (NORVASC) 10 MG tablet [Pharmacy Med Name: AMLODIPINE BESYLATE 10 MG TAB] 90 tablet 1    Sig: TAKE 1 TABLET BY MOUTH EVERY DAY     Cardiovascular: Calcium Channel Blockers 2 Failed - 06/27/2023  9:20 AM      Failed - Last BP in normal range    BP Readings from Last 1 Encounters:  05/08/23 (!) 167/95         Failed - Valid encounter within last 6 months    Recent Outpatient Visits           7 months ago Type 2 diabetes mellitus without complication, without long-term current use of insulin (HCC)   [facility] [place]. [facility] Drucilla Chalet, RPH-CPP   7 months ago Cervical radiculopathy   [facility] [place]. [facility] Jonah Blue B, MD   2 years ago Benign prostatic hyperplasia with incomplete bladder emptying   [facility] [place]. [facility] Hoy Register, MD   2 years ago Screening for prostate cancer   [facility] [place]. [facility] [place], [place], [place]   3 years ago Suspected sleep apnea    [facility] [place]. [facility], Odette Horns, MD              Passed - Last Heart Rate in normal range    Pulse Readings from Last 1 Encounters:  05/08/23 64

## 2023-07-11 ENCOUNTER — Emergency Department (HOSPITAL_COMMUNITY): Payer: Self-pay

## 2023-07-11 ENCOUNTER — Other Ambulatory Visit: Payer: Self-pay

## 2023-07-11 ENCOUNTER — Encounter (HOSPITAL_COMMUNITY): Payer: Self-pay

## 2023-07-11 ENCOUNTER — Emergency Department (HOSPITAL_COMMUNITY)
Admission: EM | Admit: 2023-07-11 | Discharge: 2023-07-12 | Discharge status: Against Medical Advice | Payer: Self-pay | Attending: Student | Admitting: Student

## 2023-07-11 ENCOUNTER — Other Ambulatory Visit: Payer: Self-pay | Admitting: Family Medicine

## 2023-07-11 DIAGNOSIS — M546 Pain in thoracic spine: Secondary | ICD-10-CM | POA: Insufficient documentation

## 2023-07-11 DIAGNOSIS — M25532 Pain in left wrist: Secondary | ICD-10-CM | POA: Diagnosis not present

## 2023-07-11 DIAGNOSIS — Z5321 Procedure and treatment not carried out due to patient leaving prior to being seen by health care provider: Secondary | ICD-10-CM | POA: Diagnosis not present

## 2023-07-11 DIAGNOSIS — M25512 Pain in left shoulder: Secondary | ICD-10-CM | POA: Insufficient documentation

## 2023-07-11 DIAGNOSIS — Y92410 Unspecified street and highway as the place of occurrence of the external cause: Secondary | ICD-10-CM | POA: Diagnosis not present

## 2023-07-11 DIAGNOSIS — I10 Essential (primary) hypertension: Secondary | ICD-10-CM

## 2023-07-11 NOTE — Telephone Encounter (Signed)
 Called patient to schedule appt for medication refills. Earliest appt available not with PCP. Scheduled appt 08/14/23.

## 2023-07-11 NOTE — Telephone Encounter (Signed)
 Requested medication (s) are due for refill today: yes   Requested medication (s) are on the active medication list: yes   Last refill:  06/27/23 #30 0 refills   Future visit scheduled: yes 08/14/23.   Notes to clinic:  future visit scheduled 08/14/23. Can patient get a 2nd courtesy refill? Patient almost out of medication.     Requested Prescriptions  Pending Prescriptions Disp Refills   amLODipine  (NORVASC ) 10 MG tablet [Pharmacy Med Name: AMLODIPINE  BESYLATE 10 MG TAB] 90 tablet 1    Sig: TAKE 1 TABLET BY MOUTH EVERY DAY     Cardiovascular: Calcium  Channel Blockers 2 Failed - 07/11/2023  2:23 PM      Failed - Last BP in normal range    BP Readings from Last 1 Encounters:  05/08/23 (!) 167/95         Failed - Valid encounter within last 6 months    Recent Outpatient Visits           7 months ago Type 2 diabetes mellitus without complication, without long-term current use of insulin (HCC)   [facility] [place]. [facility] Fleeta Tonia Garnette LITTIE, RPH-CPP   8 months ago Cervical radiculopathy   [facility] [place]. [facility] Vicci Sober B, MD   2 years ago Benign prostatic hyperplasia with incomplete bladder emptying   [facility] [place]. [facility] Delbert Clam, MD   2 years ago Screening for prostate cancer   [facility] [place]. [facility] [place], [place], [place]   3 years ago Suspected sleep apnea   [facility] [place]. [facility] [place] [place], Clam, MD              Passed - Last Heart Rate in normal range    Pulse Readings from Last 1 Encounters:  05/08/23 64
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
## 2023-07-12 NOTE — ED Notes (Signed)
PT decided to leave . 

## 2023-08-14 ENCOUNTER — Encounter: Payer: Self-pay | Admitting: Nurse Practitioner

## 2023-08-14 ENCOUNTER — Ambulatory Visit: Payer: Self-pay | Attending: Nurse Practitioner | Admitting: Nurse Practitioner

## 2023-08-14 DIAGNOSIS — E119 Type 2 diabetes mellitus without complications: Secondary | ICD-10-CM

## 2023-08-14 DIAGNOSIS — Z7984 Long term (current) use of oral hypoglycemic drugs: Secondary | ICD-10-CM

## 2023-08-14 DIAGNOSIS — I10 Essential (primary) hypertension: Secondary | ICD-10-CM

## 2023-08-14 LAB — POCT GLYCOSYLATED HEMOGLOBIN (HGB A1C): Hemoglobin A1C: 8.4 % — AB (ref 4.0–5.6)

## 2023-08-14 MED ORDER — AMLODIPINE BESYLATE 10 MG PO TABS: 10.0000 mg | ORAL_TABLET | Freq: Every day | ORAL | 1 refills | Status: DC

## 2023-08-14 MED ORDER — METFORMIN HCL 500 MG PO TABS: 500.0000 mg | ORAL_TABLET | Freq: Two times a day (BID) | ORAL | 0 refills | Status: DC

## 2023-08-14 NOTE — Progress Notes (Signed)
 I have seen and examined this patient with the advanced practice provider STUDENT and agree with the note below

## 2023-08-14 NOTE — Progress Notes (Signed)
 Assessment & Plan:  [name] was seen today for medication refill.  Diagnoses and all orders for this visit:  Type 2 diabetes mellitus without complication, without long-term current use of insulin  Elevated Hgb A1C 8.8 Metformin 500 mg take twice per day DASH diet or Mediterranean diet to help with weight loss  Exercise 3-5 times per week least 30 minutes per day Take medications as prescribed Follow up with PCP in 3 months    Patient has been counseled on age-appropriate routine health concerns for screening and prevention. These are reviewed and up-to-date. Referrals have been placed accordingly. Immunizations are up-to-date or declined.     Subjective:   Chief Complaint  Patient presents with   Medication Refill    John Collins 63 y.o. male presents to office today for medication refill and diabetes. He is a patient of Dr Alvis Lemmings  He has a past medical history of Cervical radiculopathy, DM and Hypertension.   States he discontinued metformin due to potential side effects of medication that he googled online. He also states he does not feel the metformin was effective by lowering his glucose levels but he does not have a log with him today of his readings. He stopped taking metformin some time ago.  A1C currently 8.8, he is agreeable to restart Metformin 500 mg bid. States he has been exercising and modifying his diet by eating foods recommended to reduce blood sugar but is unable to recount any specific or exercise modifications. Educated pt keep a log of foods, blood sugar readings, exercise and blood pressure.   Patient reported he will adhere to medication regimen. Will need to follow up in 3 months with his provider to recheck Hgb A1C.    LDL not at goal. His not taking his atorvastatin daily as prescribed.  Lab Results  Component Value Date   LDLCALC 202 (H) 11/09/2022  Blood pressure is well controlled.  BP Readings from Last 3 Encounters:  08/14/23 138/84   07/11/23 (!) 141/86  05/08/23 (!) 167/95       Review of Systems  Constitutional: Negative.   HENT: Negative.    Eyes: Negative.   Respiratory: Negative.    Gastrointestinal: Negative.   Genitourinary: Negative.  Negative for frequency and urgency.  Musculoskeletal: Negative.   Skin: Negative.   Neurological: Negative.   Endo/Heme/Allergies: Negative.   Psychiatric/Behavioral: Negative.      Past Medical History:  Diagnosis Date   Cervical radiculopathy    Hypertension     Past Surgical History:  Procedure Laterality Date   COLONOSCOPY WITH PROPOFOL N/A 03/01/2014   Procedure: COLONOSCOPY WITH PROPOFOL;  Surgeon: Charolett Bumpers, MD;  Location: [facility];  Service: Endoscopy;  Laterality: N/A;    Family History  Problem Relation Age of Onset   Thyroid disease Mother     Social History Reviewed with no changes to be made today.   Outpatient Medications Prior to Visit  Medication Sig Dispense Refill   amLODipine (NORVASC) 10 MG tablet TAKE 1 TABLET BY MOUTH EVERY DAY 90 tablet 0   atorvastatin (LIPITOR) 20 MG tablet TAKE 1 TABLET BY MOUTH EVERY DAY (Patient not taking: Reported on 08/14/2023) 90 tablet 1   Blood Glucose Monitoring Suppl (ACCU-CHEK GUIDE ME) w/Device KIT USE TO CHECK BLOOD SUGAR ONCE DAILY. E11.9 (Patient not taking: Reported on 08/14/2023) 1 kit 0   DULoxetine (CYMBALTA) 60 MG capsule TAKE 1 CAPSULE BY MOUTH EVERY DAY (Patient not taking: Reported on 08/14/2023) 90 capsule 3  fluticasone (FLONASE) 50 MCG/ACT nasal spray Place 1 spray into both nostrils daily. (Patient not taking: Reported on 08/14/2023) 16 g 0   gabapentin (NEURONTIN) 300 MG capsule Take 3 capsules (900 mg total) by mouth at bedtime. (Patient not taking: Reported on 08/14/2023) 90 capsule 11   glucose blood (ONETOUCH VERIO) test strip Use to check blood sugar once daily. E11.9 (Patient not taking: Reported on 08/14/2023) 100 each 2   Lancets (ONETOUCH DELICA PLUS LANCET33G) MISC Use to  check blood sugar once daily. E11.9 (Patient not taking: Reported on 08/14/2023) 100 each 2   amoxicillin-clavulanate (AUGMENTIN) 875-125 MG tablet Take 1 tablet by mouth every 12 (twelve) hours. (Patient not taking: Reported on 08/14/2023) 14 tablet 0   azithromycin (ZITHROMAX) 250 MG tablet Take 1 tablet (250 mg total) by mouth daily. Take first 2 tablets together, then 1 every day until finished. (Patient not taking: Reported on 08/14/2023) 6 tablet 0   benzonatate (TESSALON) 100 MG capsule Take 1 capsule (100 mg total) by mouth 3 (three) times daily as needed for cough. (Patient not taking: Reported on 08/14/2023) 21 capsule 0   metFORMIN (GLUCOPHAGE) 500 MG tablet TAKE 1 TABLET BY MOUTH 2 TIMES DAILY WITH A MEAL. (Patient not taking: Reported on 08/14/2023) 60 tablet 0   methocarbamol (ROBAXIN) 500 MG tablet Take 1 tablet (500 mg total) by mouth 2 (two) times daily as needed for muscle spasms. (Patient not taking: Reported on 08/14/2023) 60 tablet 0   No facility-administered medications prior to visit.    Allergies  Allergen Reactions   Ibuprofen Other (See Comments)   Lisinopril     Cough        Objective:    BP 138/84 (BP Location: Left Arm, Patient Position: Sitting, Cuff Size: Normal)   Pulse 80   Resp 19   Ht 5\' 8"  (1.727 m)   Wt 199 lb (90.3 kg)   SpO2 100%   BMI 30.26 kg/m  Wt Readings from Last 3 Encounters:  08/14/23 199 lb (90.3 kg)  07/11/23 200 lb (90.7 kg)  04/14/23 194 lb 7.1 oz (88.2 kg)   BP Readings from Last 3 Encounters:  08/14/23 138/84  07/11/23 (!) 141/86  05/08/23 (!) 167/95    Physical Exam Vitals and nursing note reviewed.  Constitutional:      Appearance: Normal appearance.  HENT:     Head: Normocephalic.  Cardiovascular:     Rate and Rhythm: Normal rate and regular rhythm.     Heart sounds: Normal heart sounds.  Pulmonary:     Effort: Pulmonary effort is normal.     Breath sounds: Normal breath sounds.  Musculoskeletal:        General:  Normal range of motion.     Cervical back: Normal range of motion.  Skin:    General: Skin is warm and dry.  Neurological:     General: No focal deficit present.     Mental Status: He is alert and oriented to person, place, and time.  Psychiatric:        Attention and Perception: Attention normal.        Mood and Affect: Mood normal.        Speech: Speech normal.        Behavior: Behavior normal. Behavior is cooperative.        Thought Content: Thought content normal.        Cognition and Memory: Cognition normal.        Judgment: Judgment normal.  Patient has been counseled extensively about nutrition and exercise as well as the importance of adherence with medications and regular follow-up. The patient was given clear instructions to go to ER or return to medical center if symptoms don't improve, worsen or new problems develop. The patient verbalized understanding.   Follow-up: Return in about 3 months (around 11/14/2023).   Joette Catching, BSN, RN -student FNP [facility] [place], [place] [phone]   08/14/2023, 5:26 PM

## 2023-08-16 LAB — CMP14+EGFR
ALT: 22 IU/L (ref 0–44)
AST: 20 IU/L (ref 0–40)
Albumin: 4.7 g/dL (ref 3.9–4.9)
Alkaline Phosphatase: 108 IU/L (ref 44–121)
BUN/Creatinine Ratio: 8 — ABNORMAL LOW (ref 10–24)
BUN: 10 mg/dL (ref 8–27)
Bilirubin Total: 0.4 mg/dL (ref 0.0–1.2)
CO2: 23 mmol/L (ref 20–29)
Calcium: 9.4 mg/dL (ref 8.6–10.2)
Chloride: 103 mmol/L (ref 96–106)
Creatinine, Ser: 1.26 mg/dL (ref 0.76–1.27)
Globulin, Total: 2.8 g/dL (ref 1.5–4.5)
Glucose: 222 mg/dL — ABNORMAL HIGH (ref 70–99)
Potassium: 4.6 mmol/L (ref 3.5–5.2)
Sodium: 142 mmol/L (ref 134–144)
Total Protein: 7.5 g/dL (ref 6.0–8.5)
eGFR: 64 mL/min/{1.73_m2} (ref >59)

## 2023-08-16 LAB — MICROALBUMIN / CREATININE URINE RATIO
Creatinine, Urine: 484.7 mg/dL
Microalb/Creat Ratio: 12 mg/g{creat} (ref 0–29)
Microalbumin, Urine: 56.1 ug/mL

## 2023-08-18 ENCOUNTER — Encounter: Payer: Self-pay | Admitting: Nurse Practitioner

## 2023-11-18 ENCOUNTER — Other Ambulatory Visit: Payer: Self-pay | Admitting: Nurse Practitioner

## 2023-11-18 DIAGNOSIS — E119 Type 2 diabetes mellitus without complications: Secondary | ICD-10-CM

## 2024-01-24 ENCOUNTER — Other Ambulatory Visit: Payer: Self-pay | Admitting: Internal Medicine

## 2024-01-24 DIAGNOSIS — E119 Type 2 diabetes mellitus without complications: Secondary | ICD-10-CM

## 2024-05-16 ENCOUNTER — Other Ambulatory Visit: Payer: Self-pay | Admitting: Nurse Practitioner

## 2024-05-16 DIAGNOSIS — I10 Essential (primary) hypertension: Secondary | ICD-10-CM

## 2024-06-12 ENCOUNTER — Other Ambulatory Visit: Payer: Self-pay | Admitting: Family Medicine

## 2024-06-12 DIAGNOSIS — I10 Essential (primary) hypertension: Secondary | ICD-10-CM

## 2024-06-15 ENCOUNTER — Ambulatory Visit: Payer: Self-pay

## 2024-06-15 ENCOUNTER — Telehealth: Payer: Self-pay

## 2024-06-15 ENCOUNTER — Other Ambulatory Visit: Payer: Self-pay | Admitting: Pharmacist

## 2024-06-15 ENCOUNTER — Other Ambulatory Visit: Payer: Self-pay

## 2024-06-15 VITALS — BP 135/87 | HR 69 | Temp 97.8°F | Resp 16 | Ht 68.0 in | Wt 200.0 lb

## 2024-06-15 DIAGNOSIS — Z7984 Long term (current) use of oral hypoglycemic drugs: Secondary | ICD-10-CM

## 2024-06-15 DIAGNOSIS — E119 Type 2 diabetes mellitus without complications: Secondary | ICD-10-CM

## 2024-06-15 DIAGNOSIS — R350 Frequency of micturition: Secondary | ICD-10-CM

## 2024-06-15 LAB — POCT GLYCOSYLATED HEMOGLOBIN (HGB A1C): Hemoglobin A1C: 5.8 % — AB (ref 4.0–5.6)

## 2024-06-15 MED ORDER — TRUE METRIX BLOOD GLUCOSE TEST VI STRP
ORAL_STRIP | 6 refills | Status: AC
  Filled 2024-06-15: qty 100, 33d supply, fill #0

## 2024-06-15 MED ORDER — TRUE METRIX METER W/DEVICE KIT
PACK | 0 refills | Status: AC
  Filled 2024-06-15: qty 1, 30d supply, fill #0

## 2024-06-15 MED ORDER — ACCU-CHEK GUIDE TEST VI STRP
ORAL_STRIP | 2 refills | Status: DC
  Filled 2024-06-15: qty 100, 100d supply, fill #0

## 2024-06-15 MED ORDER — METFORMIN HCL 500 MG PO TABS
500.0000 mg | ORAL_TABLET | Freq: Two times a day (BID) | ORAL | 0 refills | Status: AC
  Filled 2024-06-15: qty 60, 30d supply, fill #0

## 2024-06-15 MED ORDER — TRUEPLUS LANCETS 28G MISC
6 refills | Status: AC
  Filled 2024-06-15: qty 100, 33d supply, fill #0

## 2024-06-15 NOTE — Telephone Encounter (Signed)
 Patient has been informed that medication has been sent to [facility] pharmacy and he was informed to reach out to discuss the cost.

## 2024-06-15 NOTE — Progress Notes (Unsigned)
" ° ° ° °  Patient ID: John Collins, male    DOB: Oct 14, 1960  MRN: [id number]  CC: Medicare Wellness (Off balance from medication but he has  medication as of now)   Subjective: John Collins is a 64 y.o. male with past medical history of diabetes who presents to clinic with concerns due to not taking metformin  for 3 weeks due to being out of medication.  Has since restarted medication.  Patient reports that he felt like his glucose was elevated but seems to believe that it was more psychological.  Home glucose readings around 130.   In addition patient reports increased urinary frequency and having to go to the bathroom around 3 times at night.  Feels as though he does not have a consistent flow and is not emptying his bladder correctly.  Denies pain, nausea or blood in urine.  Allergies[1]  ROS: Review of Systems Negative except as stated above  PHYSICAL EXAM: BP 135/87   Pulse 69   Temp 97.8 F (36.6 C) (Oral)   Resp 16   Ht 5' 8 (1.727 m)   Wt 200 lb (90.7 kg)   SpO2 97%   BMI 30.41 kg/m   Physical Exam  General: well-appearing, no acute distress Skin: no jaundice, rashes, or lesions Cardiovascular: regular heart rate and rhythm, normal S1/S2, no murmurs, gallops, or rubs, peripheral pulses 2+ bilaterally Chest: no skeletal deformity, lungs clear to auscultation bilaterally, equal breath sounds bilaterally Musculoskeletal: normal gait Extremities: no peripheral edema  ASSESSMENT AND PLAN:  1. Type 2 diabetes mellitus without complication, without long-term current use of insulin (HCC) (Primary) - POCT glycosylated hemoglobin (Hb A1C) is 5.8 which is at goal.  - Continue metformin  500 mg twice daily - Follow-up with PCP  2. Increased urinary frequency - Increased urinary frequency likely caused by inadequate bladder emptying.  Patient educated on possible causes of benign prostatic hyperplasia that could be causing urinary retention and improper emptying.  Discussed  possible medication use for this but patient would like to defer at this time.  Instructed to follow-up with PCP.    Patient was given the opportunity to ask questions.  Patient verbalized understanding of the plan and was able to repeat key elements of the plan.    Orders Placed This Encounter  Procedures   POCT glycosylated hemoglobin (Hb A1C)       No follow-ups on file.  Sula Cower Vanesha [place], PA-C      [1]  Allergies Allergen Reactions   Ibuprofen  Other (See Comments)   Lisinopril     Cough    "

## 2024-06-15 NOTE — Telephone Encounter (Signed)
 Noted. Patient has  Ov visit 06/16/2023

## 2024-06-15 NOTE — Progress Notes (Unsigned)
 SABRA

## 2024-06-15 NOTE — Telephone Encounter (Signed)
 Copied from CRM #[id number]. Topic: Clinical - Medication Question >> Jun 15, 2024 12:59 PM Joesph B wrote: Reason for CRM: patient cannot afford his medication metFORMIN  (GLUCOPHAGE ) 500 MG tablet and ACCU-CHEK GUIDE TEST test strip.   He wants to know if there is a patient assistance program to get medication at no cost? He does not want to stop taking the medication but it is too expensive.

## 2024-06-15 NOTE — Telephone Encounter (Signed)
 FYI Only or Action Required?: FYI only for provider: appointment scheduled on 1/12.  Patient was last seen in primary care on 08/14/2023 by Theotis Haze ORN, NP.  Called Nurse Triage reporting Dizziness and Fatigue.  Symptoms began several weeks ago.  Interventions attempted: Rest, hydration, or home remedies.  Symptoms are: stable.  Triage Disposition: See Physician Within 24 Hours  Patient/caregiver understands and will follow disposition?: Yes     Copied from CRM #[id number]. Topic: Clinical - Red Word Triage >> Jun 15, 2024  1:04 PM John Collins wrote: Red Word that prompted transfer to Nurse Triage: fatigue, balance is off, dizziness, been off medication for two weeks (metformin ) last blood sugar test 05/13/24- 154.   Reason for Disposition  [1] Symptoms of high blood sugar (e.g., increased thirst, frequent urination, weight loss) AND [2] not able to test blood glucose    Increased frequency.  Additional Information  Commented on: Taking a medicine that could cause weakness (e.g., blood pressure medications, diuretics)    Blood pressure medication, has not been able to take Metformin .  Answer Assessment - Initial Assessment Questions BLOOD GLUCOSE: What is your blood glucose level?      Not monitoring. Out of test strips. Last check was 12/10 AM, 154.    Range prior: 12/9- 128, 12/8-145, 12/7- 134, 12/6- 129, 12/4- 109  ONSET: When did you check the blood glucose?     Last checked on 12/10 when then ran out of strips.   TYPE 1 or 2:  Do you know what type of diabetes you have?  (e.g., Type 1, Type 2, Gestational; doesn't know)      Type 2 per chart  INSULIN: Do you take insulin? What type of insulin(s) do you use? What is the mode of delivery? (syringe, pen; injection or pump)?      Only takes Metformin  and BP medication.   DIABETES PILLS: Do you take any pills for your diabetes? If Yes, ask: Have you missed taking any pills recently?     Out of metformin  for  2-3 weeks (approximately).   OTHER SYMPTOMS: Do you have any symptoms? (e.g., fever, frequent urination, difficulty breathing, dizziness, weakness, vomiting)     Off balance after sitting or at night when getting up, has to be careful how he steps, feels like losing balance.  Denies vision changes.  Denies numbness/tingling, bottom of feet are burning, especially at night, relates to diabetes condition. Weakness described as feels like energy level has gone down, started when stopped Metformin . Does not have enough energy to continue chores in addition to working. Is currently on vacation and reports getting enough sleep.  Is urinating more frequently but no increased thirst or odor (sweet odor) noted to urine.  Answer Assessment - Initial Assessment Questions Disregard this assessment, triager unable to delete.  Answer Assessment - Initial Assessment Questions 1. DESCRIPTION: Describe how you are feeling.     Denies dizzy or veritigo, but feels off balance and has to be very cautious stepping.  Concerned this is related to being off medication or age.  Does not feel this is completely related to medication.  Gets shaky about balance, denies shaking  2. SEVERITY: How bad is it?  Can you stand and walk?     Mild fatigue and intermittent feelings of off balance.   3. ONSET: When did these symptoms begin? (e.g., hours, days, weeks, months)     2-3 weeks ago  4. CAUSE: What do you think is causing the weakness  or fatigue? (e.g., not drinking enough fluids, medical problem, trouble sleeping)     Possibly due to being off Metformin  but also mentions age and being off work and decreased motivation.   5. NEW MEDICINES:  Have you started on any new medicines recently? (e.g., opioid pain medicines, benzodiazepines, muscle relaxants, antidepressants, antihistamines, neuroleptics, beta blockers)     None reported, also takes BP medication and has continued to take.   6. OTHER  SYMPTOMS: Do you have any other symptoms? (e.g., chest pain, fever, cough, SOB, vomiting, diarrhea, bleeding, other areas of pain)     Denies chest pain, trouble breathing, nausea, vomiting, bleeding. Sleep is okay,  urinating more often, takes longer to void, lately 2-3 trips at night.  No change in urine odor. Not thirsty.    Living in a brand new home, no visible mold growth presence. No water leaks or trouble with humidity that are known.   Reports facing financial challenges, cannot afford medication and seeking assistance.  Protocols used: Diabetes - High Blood Sugar-A-AH, Hospice - Weakness (Generalized) and Fatigue-A-AH, Weakness (Generalized) and Fatigue-A-AH
